# Patient Record
Sex: Female | Born: 1990 | Race: Black or African American | Hispanic: No | Marital: Single | Smoking: Former smoker
Health system: Southern US, Community
[De-identification: ages and names within clinical notes are randomized; demographics above are authoritative.]

## PROBLEM LIST (undated history)

## (undated) ENCOUNTER — Inpatient Hospital Stay (HOSPITAL_COMMUNITY): Payer: Self-pay

## (undated) DIAGNOSIS — R011 Cardiac murmur, unspecified: Secondary | ICD-10-CM

## (undated) DIAGNOSIS — D573 Sickle-cell trait: Secondary | ICD-10-CM

## (undated) DIAGNOSIS — M6740 Ganglion, unspecified site: Secondary | ICD-10-CM

## (undated) HISTORY — PX: HAND SURGERY: SHX662

## (undated) HISTORY — DX: Cardiac murmur, unspecified: R01.1

---
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
## 2012-03-25 ENCOUNTER — Encounter (HOSPITAL_COMMUNITY): Payer: Self-pay | Admitting: Emergency Medicine

## 2012-03-25 ENCOUNTER — Emergency Department (INDEPENDENT_AMBULATORY_CARE_PROVIDER_SITE_OTHER)
Admission: EM | Admit: 2012-03-25 | Discharge: 2012-03-25 | Disposition: A | Discharge status: Home / Self Care | Payer: Medicaid Other | Source: Home / Self Care | Attending: Emergency Medicine | Admitting: Emergency Medicine

## 2012-03-25 DIAGNOSIS — L03039 Cellulitis of unspecified toe: Secondary | ICD-10-CM

## 2012-03-25 DIAGNOSIS — L600 Ingrowing nail: Secondary | ICD-10-CM

## 2012-03-25 DIAGNOSIS — L03031 Cellulitis of right toe: Secondary | ICD-10-CM

## 2012-03-25 HISTORY — DX: Ganglion, unspecified site: M67.40

## 2012-03-25 MED ORDER — IBUPROFEN 600 MG PO TABS: 600.0000 mg | ORAL_TABLET | Freq: Four times a day (QID) | ORAL | Status: AC | PRN

## 2012-03-25 MED ORDER — CHLORHEXIDINE GLUCONATE 4 % EX LIQD: 60.0000 mL | Freq: Every day | CUTANEOUS | Status: AC | PRN

## 2012-03-25 MED ORDER — SULFAMETHOXAZOLE-TRIMETHOPRIM 800-160 MG PO TABS: 1.0000 | ORAL_TABLET | Freq: Two times a day (BID) | ORAL | Status: AC

## 2012-03-25 NOTE — ED Notes (Signed)
Patient received referral to foot center

## 2012-03-25 NOTE — Discharge Instructions (Signed)
Soak  Your foot in a solution of the chlorhexadine and warm water once a day. Thewn, ewdge some corton underneath your toenail. Then apply bacitracin or another anitbiotic ointment to the toe and wrap it in a clean bandage dressing. Wear cotton socks and wide shoes. Take the medication as written.Return if you get worse, have a  fever >100.4, or for any concerns.   Go to www.goodrx.com to look up your medications. This will give you a list of where you can find your prescriptions at the most affordable prices.

## 2012-03-25 NOTE — ED Notes (Signed)
Seen at [facility] in February for ingrown toenail.  Patient reports to will get a pocket of pus, then bleed.  Patient reports toe is improving.  Patient reports soaking in epsom salt water, but does not know how to take care of toe beyond soaking, how to dress

## 2012-03-26 NOTE — ED Provider Notes (Signed)
History     CSN: [id number]  Arrival date & time 03/25/12  1120   First MD Initiated Contact with Patient 03/25/12 1148      Chief Complaint  Patient presents with  . Toe Pain    (Consider location/radiation/quality/duration/timing/severity/associated sxs/prior treatment) HPI Comments: Patient status post ingrown toe nail removal 2 months ago here for persistent toe pain, swelling at the nail fold, purulent drainage. She's been soaking her foot in Epsom salts and water, but has not been doing any other local wound care. [facility]. No nausea, vomiting, fevers, erythema streaking up her foot, numbness. Patient is not diabetic.  Patient is a 21 y.o. female presenting with toe pain. The history is provided by the patient. No language interpreter was used.  Toe Pain This is a chronic problem. The current episode started more than 1 week ago. The problem occurs constantly. The problem has not changed since onset.The symptoms are aggravated by walking. The symptoms are relieved by nothing.    Past Medical History  Diagnosis Date  . Ganglion cyst     right hand    Past Surgical History  Procedure Date  . Hand surgery     History reviewed. No pertinent family history.  History  Substance Use Topics  . Smoking status: Never Smoker   . Smokeless tobacco: Not on file  . Alcohol Use: No    OB History    Grav Para Term Preterm Abortions TAB SAB Ect Mult Living                  Review of Systems  Constitutional: Negative for fever.  Gastrointestinal: Negative for nausea and vomiting.  Musculoskeletal: Negative for joint swelling.  Skin: Positive for color change and wound.    Allergies  Review of patient's allergies indicates no known allergies.  Home Medications   Current Outpatient Rx  Name Route Sig Dispense Refill  . CHLORHEXIDINE GLUCONATE 4 % EX LIQD Topical Apply 60 mLs (4 application total) topically daily as  needed. Use daily when bathing for 1-2 weeks 120 mL 0  . IBUPROFEN 600 MG PO TABS Oral Take 1 tablet (600 mg total) by mouth every 6 (six) hours as needed for pain. 30 tablet 0  . SULFAMETHOXAZOLE-TRIMETHOPRIM 800-160 MG PO TABS Oral Take 1 tablet by mouth 2 (two) times daily. 20 tablet 0    BP 121/73  Pulse 85  Temp(Src) 98.7 F (37.1 C) (Oral)  Resp 16  SpO2 100%  Physical Exam  Nursing note and vitals reviewed. Constitutional: She is oriented to person, place, and time. She appears well-developed and well-nourished. No distress.  HENT:  Head: Normocephalic and atraumatic.  Eyes: Conjunctivae and EOM are normal.  Neck: Normal range of motion.  Cardiovascular: Normal rate.   Pulmonary/Chest: Effort normal.  Abdominal: She exhibits no distension.  Musculoskeletal: Normal range of motion.       Feet:       Hypertrophied Tissue medial nail fold. Scant purulent expressible drainage. Nail appears to be growing back. Joint stable. No bony tenderness, signs of infection going of foot.  Neurological: She is alert and oriented to person, place, and time.  Skin: Skin is warm and dry.  Psychiatric: She has a normal mood and affect. Her behavior is normal. Judgment and thought content normal.    ED Course  Procedures (including critical care time)  Labs Reviewed - No data to display No results found.   1.  Cellulitis of toe of right foot   2. Ingrown right big toenail       MDM  [facility]. 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 Sending her home on Bactrim, will have her place cotton wedge underneath her toenail, keep her toe covered with bacitracin, and a clean dressing until she is able to be seen  by podiatrist  Luiz Blare, MD 03/26/12 2206

## 2013-01-28 ENCOUNTER — Encounter (HOSPITAL_COMMUNITY): Payer: Self-pay | Admitting: Emergency Medicine

## 2013-01-28 ENCOUNTER — Emergency Department (HOSPITAL_COMMUNITY)
Admission: EM | Admit: 2013-01-28 | Discharge: 2013-01-28 | Disposition: A | Discharge status: Home / Self Care | Payer: Medicaid Other | Attending: Emergency Medicine | Admitting: Emergency Medicine

## 2013-01-28 DIAGNOSIS — L03039 Cellulitis of unspecified toe: Secondary | ICD-10-CM | POA: Insufficient documentation

## 2013-01-28 DIAGNOSIS — L02619 Cutaneous abscess of unspecified foot: Secondary | ICD-10-CM | POA: Insufficient documentation

## 2013-01-28 DIAGNOSIS — L03031 Cellulitis of right toe: Secondary | ICD-10-CM

## 2013-01-28 DIAGNOSIS — Z8739 Personal history of other diseases of the musculoskeletal system and connective tissue: Secondary | ICD-10-CM | POA: Insufficient documentation

## 2013-01-28 MED ORDER — CHLORHEXIDINE GLUCONATE 4 % EX LIQD: 60.0000 mL | Freq: Every day | CUTANEOUS | Status: DC | PRN

## 2013-01-28 MED ORDER — IBUPROFEN 800 MG PO TABS: 800.0000 mg | ORAL_TABLET | Freq: Three times a day (TID) | ORAL | Status: DC

## 2013-01-28 MED ORDER — IBUPROFEN 400 MG PO TABS
800.0000 mg | ORAL_TABLET | Freq: Once | ORAL | Status: AC
  Administered 2013-01-28: 800 mg via ORAL
  Filled 2013-01-28: qty 2

## 2013-01-28 MED ORDER — SULFAMETHOXAZOLE-TRIMETHOPRIM 800-160 MG PO TABS: 1.0000 | ORAL_TABLET | Freq: Two times a day (BID) | ORAL | Status: AC

## 2013-01-28 NOTE — ED Notes (Signed)
C/o R great toe pain since having ingrown toenail removed 1 year ago.  Pain and swelling worse since yesterday.  Denies recent injury.

## 2013-01-31 NOTE — ED Provider Notes (Signed)
History     CSN: [id number]  Arrival date & time 01/28/13  1743   First MD Initiated Contact with Patient 01/28/13 1811      Chief Complaint  Patient presents with  . Toe Pain    (Consider location/radiation/quality/duration/timing/severity/associated sxs/prior treatment) HPI Comments: Patient is a 22 year old female who presents with a 1 year history of right great toe pain. Symptoms started gradually and progressively worsened since the onset. Patient reports the pain acutely worsened last night without known trigger. Patient did not try anything for pain. Palpation of toe makes the pain worse. Nothing makes the pain better. Patient reports associated redness and swelling. No fever.   Patient is a 22 y.o. female presenting with toe pain.  Toe Pain    Past Medical History  Diagnosis Date  . Ganglion cyst     right hand    Past Surgical History  Procedure Laterality Date  . Hand surgery      No family history on file.  History  Substance Use Topics  . Smoking status: Never Smoker   . Smokeless tobacco: Not on file  . Alcohol Use: No    OB History   Grav Para Term Preterm Abortions TAB SAB Ect Mult Living                  Review of Systems  Skin: Positive for color change and wound.  All other systems reviewed and are negative.    Allergies  Review of patient's allergies indicates no known allergies.  Home Medications   Current Outpatient Rx  Name  Route  Sig  Dispense  Refill  . ferrous fumarate (HEMOCYTE - 106 MG FE) 325 (106 FE) MG TABS   Oral   Take 1 tablet by mouth daily.         Marland Kitchen neomycin-bacitracin-polymyxin (NEOSPORIN) ointment   Topical   Apply 1 application topically every 12 (twelve) hours. apply to toe everyday per patient.         . chlorhexidine (HIBICLENS) 4 % external liquid   Topical   Apply 60 mLs (4 application total) topically daily as needed.   120 mL   0   . ibuprofen (ADVIL,MOTRIN) 800 MG tablet   Oral   Take 1  tablet (800 mg total) by mouth 3 (three) times daily.   21 tablet   0   . sulfamethoxazole-trimethoprim (BACTRIM DS,SEPTRA DS) 800-160 MG per tablet   Oral   Take 1 tablet by mouth 2 (two) times daily.   14 tablet   0     BP 133/75  Pulse 99  Temp(Src) 98.4 F (36.9 C) (Oral)  Resp 16  SpO2 99%  LMP 01/21/2013  Physical Exam  Nursing note and vitals reviewed. Constitutional: She is oriented to person, place, and time. She appears well-developed and well-nourished. No distress.  HENT:  Head: Normocephalic and atraumatic.  Eyes: Conjunctivae are normal.  Neck: Normal range of motion.  Cardiovascular: Normal rate and regular rhythm.  Exam reveals no gallop and no friction rub.   No murmur heard. Pulmonary/Chest: Effort normal and breath sounds normal. She has no wheezes. She has no rales. She exhibits no tenderness.  Abdominal: Soft. There is no tenderness.  Musculoskeletal: Normal range of motion.  Neurological: She is alert and oriented to person, place, and time. Coordination normal.  Speech is goal-oriented. Moves limbs without ataxia.   Skin: Skin is warm and dry.  Erythematous right great toe that is tender to  palpation.   Psychiatric: She has a normal mood and affect. Her behavior is normal.    ED Course  Procedures (including critical care time)  Labs Reviewed - No data to display No results found.   1. Cellulitis of great toe of right foot       MDM  Patient likely has cellulitis of right great toe from ingrown toenail. Patient will be treated with Bactrim, chlorhexidine, and ibuprofen. Patient will follow up with Podiatrist.         Emilia Beck, PA-C 01/31/13 1437

## 2013-01-31 NOTE — ED Provider Notes (Signed)
Medical screening examination/treatment/procedure(s) were performed by non-physician practitioner and as supervising physician I was immediately available for consultation/collaboration.   Gwyneth Sprout, MD 01/31/13 2101

## 2013-10-03 ENCOUNTER — Emergency Department (HOSPITAL_COMMUNITY): Payer: Medicaid Other

## 2013-10-03 ENCOUNTER — Encounter (HOSPITAL_COMMUNITY): Payer: Self-pay | Admitting: Emergency Medicine

## 2013-10-03 ENCOUNTER — Emergency Department (HOSPITAL_COMMUNITY)
Admission: EM | Admit: 2013-10-03 | Discharge: 2013-10-03 | Disposition: A | Discharge status: Home / Self Care | Payer: Medicaid Other | Attending: Emergency Medicine | Admitting: Emergency Medicine

## 2013-10-03 DIAGNOSIS — O368990 Maternal care for other specified fetal problems, unspecified trimester, not applicable or unspecified: Secondary | ICD-10-CM | POA: Insufficient documentation

## 2013-10-03 DIAGNOSIS — O209 Hemorrhage in early pregnancy, unspecified: Secondary | ICD-10-CM

## 2013-10-03 DIAGNOSIS — O418X10 Other specified disorders of amniotic fluid and membranes, first trimester, not applicable or unspecified: Secondary | ICD-10-CM

## 2013-10-03 DIAGNOSIS — O4690 Antepartum hemorrhage, unspecified, unspecified trimester: Secondary | ICD-10-CM | POA: Insufficient documentation

## 2013-10-03 DIAGNOSIS — N898 Other specified noninflammatory disorders of vagina: Secondary | ICD-10-CM | POA: Insufficient documentation

## 2013-10-03 DIAGNOSIS — O9989 Other specified diseases and conditions complicating pregnancy, childbirth and the puerperium: Secondary | ICD-10-CM | POA: Insufficient documentation

## 2013-10-03 DIAGNOSIS — Z8739 Personal history of other diseases of the musculoskeletal system and connective tissue: Secondary | ICD-10-CM | POA: Insufficient documentation

## 2013-10-03 DIAGNOSIS — R110 Nausea: Secondary | ICD-10-CM | POA: Insufficient documentation

## 2013-10-03 LAB — CBC
HCT: 27.3 % — ABNORMAL LOW (ref 36.0–46.0)
Hemoglobin: 9.4 g/dL — ABNORMAL LOW (ref 12.0–15.0)
MCH: 23.4 pg — ABNORMAL LOW (ref 26.0–34.0)
MCV: 67.9 fL — ABNORMAL LOW (ref 78.0–100.0)
RBC: 4.02 MIL/uL (ref 3.87–5.11)

## 2013-10-03 LAB — URINALYSIS, ROUTINE W REFLEX MICROSCOPIC
Bilirubin Urine: NEGATIVE
Glucose, UA: NEGATIVE mg/dL
Specific Gravity, Urine: 1.012 (ref 1.005–1.030)

## 2013-10-03 LAB — OB RESULTS CONSOLE GC/CHLAMYDIA
Chlamydia: NEGATIVE
GC PROBE AMP, GENITAL: NEGATIVE

## 2013-10-03 LAB — WET PREP, GENITAL
Trich, Wet Prep: NONE SEEN
Yeast Wet Prep HPF POC: NONE SEEN

## 2013-10-03 LAB — TYPE AND SCREEN
ABO/RH(D): O NEG
Antibody Screen: NEGATIVE

## 2013-10-03 LAB — URINE MICROSCOPIC-ADD ON

## 2013-10-03 LAB — POCT PREGNANCY, URINE: Preg Test, Ur: POSITIVE — AB

## 2013-10-03 MED ORDER — RHO D IMMUNE GLOBULIN 1500 UNIT/2ML IJ SOLN
300.0000 ug | Freq: Once | INTRAMUSCULAR | Status: AC
  Administered 2013-10-03: 300 ug via INTRAMUSCULAR

## 2013-10-03 NOTE — ED Provider Notes (Signed)
CSN: [id number]     Arrival date & time 10/03/13  0028 History   First MD Initiated Contact with Patient 10/03/13 0105     Chief Complaint  Patient presents with  . Vaginal Bleeding   (Consider location/radiation/quality/duration/timing/severity/associated sxs/prior Treatment) HPI Comments: Patient is a G3P0020 who is currently pregnant; LMP 07/20/2013. She presents today for vaginal bleeding x 1 hour. Patient states symptoms have been constant since onset without any modifying factors. She endorses associated nausea. Patient denies associated fever, CP, SOB, abdominal pain or cramping, diarrhea, melena, hematochezia, urinary symptoms, pelvic pain, and numbness/tingling. Patient endorses having RhoGAM in the past; likely Rh negative, but patient not sure.  Patient is a 22 y.o. female presenting with vaginal bleeding. The history is provided by the patient. No language interpreter was used.  Vaginal Bleeding Associated symptoms: nausea     Past Medical History  Diagnosis Date  . Ganglion cyst     right hand   Past Surgical History  Procedure Laterality Date  . Hand surgery     No family history on file. History  Substance Use Topics  . Smoking status: Never Smoker   . Smokeless tobacco: Not on file  . Alcohol Use: No   OB History   Grav Para Term Preterm Abortions TAB SAB Ect Mult Living   1              Review of Systems  Gastrointestinal: Positive for nausea.  Genitourinary: Positive for vaginal bleeding.  All other systems reviewed and are negative.    Allergies  Review of patient's allergies indicates no known allergies.  Home Medications   Current Outpatient Rx  Name  Route  Sig  Dispense  Refill  . Prenatal Vit-Fe Fumarate-FA (PRENATAL MULTIVITAMIN) TABS tablet   Oral   Take 1 tablet by mouth daily at 12 noon.          BP 104/50  Pulse 84  Temp(Src) 98.7 F (37.1 C) (Oral)  Resp 16  SpO2 100%  LMP 07/20/2013  Physical Exam  Nursing note and vitals  reviewed. Constitutional: She is oriented to person, place, and time. She appears well-developed and well-nourished. No distress.  HENT:  Head: Normocephalic and atraumatic.  Eyes: Conjunctivae and EOM are normal. No scleral icterus.  Neck: Normal range of motion.  Cardiovascular: Normal rate, regular rhythm, normal heart sounds and intact distal pulses.   Pulmonary/Chest: Effort normal. No respiratory distress.  Abdominal: Soft. She exhibits no distension. There is no tenderness. There is no rebound and no guarding.  No peritoneal signs or guarding.  Genitourinary: There is no rash, tenderness, lesion or injury on the right labia. There is no rash, tenderness, lesion or injury on the left labia. Uterus is not deviated and not tender. Cervix exhibits discharge (blood and clear mucous appreciated from cervical os; os closed.). Cervix exhibits no motion tenderness and no friability. Right adnexum displays no mass, no tenderness and no fullness. Left adnexum displays no mass, no tenderness and no fullness. There is bleeding around the vagina. No erythema or tenderness around the vagina. No foreign body around the vagina. No signs of injury around the vagina. Vaginal discharge (blood and clots in vaginal vault) found.  Musculoskeletal: Normal range of motion.  Neurological: She is alert and oriented to person, place, and time.  Skin: Skin is warm and dry. No rash noted. She is not diaphoretic. No erythema. No pallor.  Psychiatric: She has a normal mood and affect. Her behavior is normal.  ED Course  Procedures (including critical care time) Labs Review Labs Reviewed  WET PREP, GENITAL - Abnormal; Notable for the following:    WBC, Wet Prep HPF POC FEW (*)    All other components within normal limits  URINALYSIS, ROUTINE W REFLEX MICROSCOPIC - Abnormal; Notable for the following:    Hgb urine dipstick LARGE (*)    All other components within normal limits  HCG, QUANTITATIVE, PREGNANCY -  Abnormal; Notable for the following:    hCG, Beta Chain, Quant, S [address]109 (*)    All other components within normal limits  CBC - Abnormal; Notable for the following:    Hemoglobin 9.4 (*)    HCT 27.3 (*)    MCV 67.9 (*)    MCH 23.4 (*)    RDW 20.4 (*)    All other components within normal limits  POCT PREGNANCY, URINE - Abnormal; Notable for the following:    Preg Test, Ur POSITIVE (*)    All other components within normal limits  GC/CHLAMYDIA PROBE AMP  URINE MICROSCOPIC-ADD ON  TYPE AND SCREEN  ABO/RH  RH IG WORKUP (INCLUDES ABO/RH)   Imaging Review US Ob Comp Less 14 Wks  10/03/2013   CLINICAL DATA:  Vaginal bleeding  EXAM: OBSTETRIC <14 WK [place] AND TRANSVAGINAL OB US  TECHNIQUE: Both transabdominal and transvaginal ultrasound examinations were performed for complete evaluation of the gestation as well as the maternal uterus, adnexal regions, and pelvic cul-de-sac. Transvaginal technique was performed to assess early pregnancy.  COMPARISON:  None.  FINDINGS: Intrauterine gestational sac: Present. There is thin subchorionic hemorrhage present anteriorly and the inferiorly, with inferior component extending over the cervical os.  Yolk sac:  Normal in size.  Embryo:  Present.  Cardiac Activity: Present.  Heart Rate:  175 bpm  CRL:   3.6 cm 10 w 3 d                  [place] EDC: 04/28/2014  Maternal uterus/adnexae: Other than the gestation, the uterus is unremarkable. The right ovary measures 1 x 1.6 x 2.9 cm. Left ovary measures 3.6 x 2.3 x 4.2 cm. Size difference related to a approximately 2 cm dominant follicle.  IMPRESSION: 1. Living single intrauterine gestation, estimated age 47 weeks 3 days. 2. Thin subchorionic hemorrhage over the internal cervical os.   Electronically Signed   By: Tiburcio Pea M.D.   On: 10/03/2013 03:38   US Ob Transvaginal  10/03/2013   CLINICAL DATA:  Vaginal bleeding  EXAM: OBSTETRIC <14 WK [place] AND TRANSVAGINAL OB US  TECHNIQUE: Both transabdominal and transvaginal  ultrasound examinations were performed for complete evaluation of the gestation as well as the maternal uterus, adnexal regions, and pelvic cul-de-sac. Transvaginal technique was performed to assess early pregnancy.  COMPARISON:  None.  FINDINGS: Intrauterine gestational sac: Present. There is thin subchorionic hemorrhage present anteriorly and the inferiorly, with inferior component extending over the cervical os.  Yolk sac:  Normal in size.  Embryo:  Present.  Cardiac Activity: Present.  Heart Rate:  175 bpm  CRL:   3.6 cm 10 w 3 d                  [place] EDC: 04/28/2014  Maternal uterus/adnexae: Other than the gestation, the uterus is unremarkable. The right ovary measures 1 x 1.6 x 2.9 cm. Left ovary measures 3.6 x 2.3 x 4.2 cm. Size difference related to a approximately 2 cm dominant follicle.  IMPRESSION: 1. Living single intrauterine  gestation, estimated age 33 weeks 3 days. 2. Thin subchorionic hemorrhage over the internal cervical os.   Electronically Signed   By: Tiburcio Pea M.D.   On: 10/03/2013 03:38    EKG Interpretation   None       MDM   1. Vaginal bleeding in pregnancy, first trimester   2. Subchorionic hemorrhage in first trimester     Patient is a 22 year old female, G3P0020 who is currently pregnant, who presents for vaginal bleeding during pregnancy. Patient without abdominal pain or adnexal tenderness on exam. She is afebrile, hemodynamically stable, as well as well and nontoxic appearing. Patient is O NEG; tx in ED with RhoGAM today. OB U/S ordered for further evaluation of symptoms. Patient without any pain complaints.  OB ultrasound significant for a live intrauterine gestation of approximately 10 weeks 3 days. There is also a thin subchorionic hemorrhage over the internal cervical os. Have consulted with OB physician on call who recommends the patient refrain from sexual intercourse given subchorionic hemorrhage. Patient appropriate for d/c with referral to [facility] for follow up and prenatal care. Return precautions discussed and patient agreeable to plan with no unaddressed concerns.   Antony Madura, PA-C 10/03/13 [phone]

## 2013-10-03 NOTE — ED Notes (Signed)
Pt to US via stretcher

## 2013-10-03 NOTE — ED Notes (Signed)
Per lab: Type O neg. Antibody screen negative. "Pt did have to have Rhogam with last pregnancy".

## 2013-10-03 NOTE — ED Notes (Signed)
Pt st's she started having vag bleeding approx 40 mins ago.  St's she passed 2 small clots and now is spotting.  Pt st's she is 10 weeks preg.  Pt denies any abd. Pain or cramping.

## 2013-10-03 NOTE — ED Provider Notes (Signed)
Medical screening examination/treatment/procedure(s) were performed by non-physician practitioner and as supervising physician I was immediately available for consultation/collaboration.  Sunnie Nielsen, MD 10/03/13 [phone]

## 2013-10-03 NOTE — ED Notes (Signed)
Pt alert, NAD, calm, interactive, resps e/u, speaking in clear complete sentences, denies sx or complaints. (Denies: pain, sob, nv, dizziness or other sx).

## 2013-10-03 NOTE — ED Notes (Signed)
(  Denies: pain, sob, nausea or other sx), pt alert, NAD, calm, interactive, family at [place].

## 2013-10-03 NOTE — ED Notes (Signed)
No changes, updated about wait, calm, NAD. Denies new sx.

## 2013-10-03 NOTE — ED Notes (Signed)
Pt. reports vaginal spotting onset this evening , pt. stated she is [redacted] weeks pregnant ( G3P0 - 1 abortion / 1 miscarriage ) , denies abdominal cramping or pain .

## 2013-10-04 LAB — RH IG WORKUP (INCLUDES ABO/RH)
ABO/RH(D): O NEG
Antibody Screen: NEGATIVE
Gestational Age(Wks): 14

## 2013-10-15 ENCOUNTER — Other Ambulatory Visit (HOSPITAL_COMMUNITY): Payer: Self-pay | Admitting: Nurse Practitioner

## 2013-10-15 DIAGNOSIS — Z3689 Encounter for other specified antenatal screening: Secondary | ICD-10-CM

## 2013-10-15 LAB — OB RESULTS CONSOLE HIV ANTIBODY (ROUTINE TESTING): HIV: NONREACTIVE

## 2013-10-15 LAB — OB RESULTS CONSOLE RPR: RPR: NONREACTIVE

## 2013-10-15 LAB — OB RESULTS CONSOLE ABO/RH: RH Type: NEGATIVE

## 2013-10-15 LAB — OB RESULTS CONSOLE HEPATITIS B SURFACE ANTIGEN: Hepatitis B Surface Ag: NEGATIVE

## 2013-10-15 LAB — OB RESULTS CONSOLE ANTIBODY SCREEN: ANTIBODY SCREEN: POSITIVE

## 2013-10-15 LAB — OB RESULTS CONSOLE RUBELLA ANTIBODY, IGM: Rubella: IMMUNE

## 2013-10-24 ENCOUNTER — Ambulatory Visit (HOSPITAL_COMMUNITY): Payer: Medicaid Other | Attending: Cardiology

## 2013-10-24 ENCOUNTER — Encounter: Payer: Self-pay | Admitting: Cardiology

## 2013-10-24 ENCOUNTER — Ambulatory Visit (INDEPENDENT_AMBULATORY_CARE_PROVIDER_SITE_OTHER): Payer: Medicaid Other | Admitting: Cardiology

## 2013-10-24 VITALS — BP 120/58 | HR 92 | Ht 64.0 in | Wt 180.0 lb

## 2013-10-24 DIAGNOSIS — R079 Chest pain, unspecified: Secondary | ICD-10-CM

## 2013-10-24 NOTE — Patient Instructions (Signed)
**Note De-identified Rosselyn Martha Obfuscation** Your physician has requested that you have an echocardiogram. Echocardiography is a painless test that uses sound waves to create images of your heart. It provides your doctor with information about the size and shape of your heart and how well your heart's chambers and valves are working. This procedure takes approximately one hour. There are no restrictions for this procedure.  Your physician recommends that you schedule a follow-up appointment in: as needed  

## 2013-10-24 NOTE — Progress Notes (Signed)
Patient ID: Rachel Heath, female   DOB: [birth year], 22 y.o.   MRN: [id number]    Patient Name: Rachel Heath Date of Encounter: 10/24/2013  Primary Care Provider:  Default, Provider, MD Primary Cardiologist:  Tobias Alexander, H   Patient Profile  Heart murmur  Problem List   Past Medical History  Diagnosis Date  . Ganglion cyst     right hand  . Heart murmur     per parent   Past Surgical History  Procedure Laterality Date  . Hand surgery      Allergies  No Known Allergies  HPI  22 year old female, currently [redacted] weeks pregnant who is being referred to [place] by her OB/GYN for her evaluation of heart murmur. Patient has no prior medical history other than GERD. She states that she has had a heart murmur since she was born and was followed by her pediatrician's. She is not very physically active, but denies any significant symptoms of shortness of breath, dizziness or prior syncopal episode. She states that she has occasional chest pain but feels more like heartburn, they usually occur at rest, worsen when laying flat and after food intake. Sometimes she feels them on exertion. No PND or orthopnea. She denies palpitations.  Home Medications  Prior to Admission medications   Medication Sig Start Date End Date Taking? Authorizing Provider  Prenatal Vit-Fe Fumarate-FA (PRENATAL MULTIVITAMIN) TABS tablet Take 1 tablet by mouth daily at 12 noon.   Yes Historical Provider, MD    Family History  No family history on file.  Social History  History   Social History  . Marital Status: Single    Spouse Name: N/A    Number of Children: N/A  . Years of Education: N/A   Occupational History  . Not on file.   Social History Main Topics  . Smoking status: Never Smoker   . Smokeless tobacco: Not on file  . Alcohol Use: No  . Drug Use: No  . Sexual Activity: Yes    Birth Control/ Protection: Injection     Comment: last depo shot 11/2011   Other Topics Concern  . Not  on file   Social History Narrative  . No narrative on file     Review of Systems, as per HPI, otherwise negative General:  No chills, fever, night sweats or weight changes.  Cardiovascular:  No chest pain, dyspnea on exertion, edema, orthopnea, palpitations, paroxysmal nocturnal dyspnea. Dermatological: No rash, lesions/masses Respiratory: No cough, dyspnea Urologic: No hematuria, dysuria Abdominal:   No nausea, vomiting, diarrhea, bright red blood per rectum, melena, or hematemesis Neurologic:  No visual changes, wkns, changes in mental status. All other systems reviewed and are otherwise negative except as noted above.  Physical Exam  Blood pressure 120/58, pulse 92, height 5\' 4"  (1.626 m), weight 180 lb (81.647 kg), last menstrual period 07/20/2013, SpO2 98.00%.  General: Pleasant, NAD Psych: Normal affect. Neuro: Alert and oriented X 3. Moves all extremities spontaneously. HEENT: Normal  Neck: Supple without bruits or JVD. Lungs:  Resp regular and unlabored, CTA. Heart: RRR no s3, s4, mild systolic murmur heart at the left lower sternal border. Abdomen: Soft, non-tender, non-distended, BS + x 4.  Extremities: No clubbing, cyanosis or edema. DP/PT/Radials 2+ and equal bilaterally.  Accessory Clinical Findings  ECG - SR, 62 BPM, normal ECG    Assessment & Plan  The 22 year old female, currently [redacted] weeks pregnant, with a mild systolic murmur that most probably represents a physiologic murmur of  pregnancy. EKG is normal. Patient is asymptomatic other than normal symptoms of pregnancy. We'll order a transthoracic echocardiogram to be performed this afternoon to rule out any significant abnormality that might interfere with her pregnancy.  Followup as needed based on echo results.  Tobias Alexander, Rexene Edison, MD 10/24/2013, 12:33 PM

## 2013-10-31 ENCOUNTER — Ambulatory Visit (HOSPITAL_COMMUNITY): Payer: Medicaid Other | Attending: Cardiology | Admitting: Cardiology

## 2013-10-31 DIAGNOSIS — R011 Cardiac murmur, unspecified: Secondary | ICD-10-CM

## 2013-11-12 ENCOUNTER — Other Ambulatory Visit: Payer: Self-pay

## 2013-12-04 ENCOUNTER — Ambulatory Visit (HOSPITAL_COMMUNITY)
Admission: RE | Admit: 2013-12-04 | Discharge: 2013-12-04 | Disposition: A | Discharge status: Home / Self Care | Payer: Medicaid Other | Source: Ambulatory Visit | Attending: Nurse Practitioner | Admitting: Nurse Practitioner

## 2013-12-04 DIAGNOSIS — Z3689 Encounter for other specified antenatal screening: Secondary | ICD-10-CM | POA: Insufficient documentation

## 2013-12-20 NOTE — L&D Delivery Note (Signed)
Delivery Note At 7:17 PM a viable female was delivered via Vaginal, Spontaneous Delivery (Presentation: ; Occiput Anterior).  APGAR: 9, 9; weight TBD.   Placenta status: Intact, Spontaneous.  Cord: 3 vessels with the following complications: None.  Anesthesia: Epidural  Episiotomy: None Lacerations: None Suture Repair: none Est. Blood Loss (mL): 300cc  Mom to postpartum.  Baby to Couplet care / Skin to Skin.  Upon arrival patient was complete and pushing with good effort, progression to delivery without difficulty of healthy baby boy. After delivery baby with good tone and cry, placed on maternal abdomen for oral suction, drying and stim. Delayed cord clamping performed and cut by FOB. Placenta delivered with some torn membranes but overall intact with 3V cord. Vaginal canal and perineum was inspected and intact. Pitocin was started and uterus massaged until bleeding slowed. Counts of sharps, instruments, and lap pads were all correct.  Saralyn PilarAlexander Karamalegos, DO [facility], PGY-1 04/19/2014, 7:33 PM  I was present for and supervised the delivery of this newborn and agree with above documentation in the resident's note.   Rulon AbideKeli Kamyiah Colantonio, M.D. [facility] 04/19/2014 7:45 PM

## 2013-12-26 ENCOUNTER — Other Ambulatory Visit (HOSPITAL_COMMUNITY): Payer: Self-pay | Admitting: Nurse Practitioner

## 2013-12-26 DIAGNOSIS — IMO0002 Reserved for concepts with insufficient information to code with codable children: Secondary | ICD-10-CM

## 2014-01-08 ENCOUNTER — Telehealth: Payer: Self-pay | Admitting: Cardiology

## 2014-01-08 NOTE — Telephone Encounter (Signed)
That's exactly correct, she has no valvular abnormality and her murmur is just a murmur of pregancy. No antibiotics are necessary.  Could you please fax the form?  Thank you,  Tobias AlexanderKatarina Quinzell Malcomb

## 2014-01-08 NOTE — Telephone Encounter (Signed)
Echo done 10/31/2013--normal physiologic murmur during pregnancy. Based on these results pt does not require antibiotics to prevent bacterial endocarditis prior to dental appt. I will forward to Dr Delton SeeNelson for review.

## 2014-01-08 NOTE — Telephone Encounter (Signed)
New message     Patient has a murmur and need dental cleaning---she is a new pt at the dentist office.  They need to know if she will need prophylaxis treatment.  A form has been faxed.  Her appt is tomorrow.  pls fax form to [phone]

## 2014-01-08 NOTE — Telephone Encounter (Signed)
I have form and will fax--just wanted your OK.

## 2014-01-09 ENCOUNTER — Telehealth: Payer: Self-pay | Admitting: Cardiology

## 2014-01-09 NOTE — Telephone Encounter (Signed)
**Note De-Identified Gohan Collister Obfuscation** Clearance form re-faxed to Clydie BraunKaren @ [facility] @ [phone]. Clydie BraunKaren is aware.

## 2014-01-09 NOTE — Telephone Encounter (Signed)
New problem   Need to know status of medical clearance that was fax 01/08/14. They need to call pt by 9am today 01/09/14 in order for pt to be seen today in there office.

## 2014-01-10 ENCOUNTER — Ambulatory Visit (INDEPENDENT_AMBULATORY_CARE_PROVIDER_SITE_OTHER): Payer: Medicaid Other | Admitting: Podiatry

## 2014-01-10 ENCOUNTER — Encounter: Payer: Self-pay | Admitting: Podiatry

## 2014-01-10 VITALS — BP 103/64 | HR 94 | Resp 20 | Ht 64.0 in | Wt 182.0 lb

## 2014-01-10 DIAGNOSIS — L600 Ingrowing nail: Secondary | ICD-10-CM

## 2014-01-10 NOTE — Progress Notes (Signed)
   Subjective:    Patient ID: Rachel Heath, female    DOB: 05-12-1991, 23 y.o.   MRN: [id number]  HPI Comments: Pt states she was here last year and had a ingrown toenail on the right foot , and now the left great toenail is acting up , left great toenail both corners , pt is [redacted] weeks pregnant      Review of Systems  Genitourinary:       Pt is [redacted] weeks pregnant   All other systems reviewed and are negative.       Objective:   Physical Exam: I have reviewed her past medical history medications allergies surgeries and social history. Pulses are strongly palpable bilateral lower extremity. Neurologic sensorium is intact per Semmes-Weinstein monofilament. Deep tendon reflexes are intact bilateral. Muscle strength is 5 over 5 dorsiflexors plantar flexors inverters everters and all intrinsic musculature is intact. Orthopedic evaluation demonstrates mild pes planus bilateral. Otherwise all joints distal to the ankle have a full range of motion without crepitation. Cutaneous evaluation demonstrates supple well hydrated cutis mark erythema to the tibial border hallux left sharp incurvated nail margin. Currently there is no signs of infection only inflammation there is no purulence and no malodor and is minimally tender on palpation.        Assessment & Plan:  Assessment: Ingrown nail tibial border hallux left.  Plan: Due to her pregnancy I suggested that she soak in Epsom salts and water to alleviate her symptoms. Once she has delivered and has completed breast-feeding we will consider surgical intervention to the tibial border of the hallux left.

## 2014-01-15 ENCOUNTER — Ambulatory Visit (HOSPITAL_COMMUNITY)
Admission: RE | Admit: 2014-01-15 | Discharge: 2014-01-15 | Disposition: A | Discharge status: Home / Self Care | Payer: Medicaid Other | Source: Ambulatory Visit | Attending: Nurse Practitioner | Admitting: Nurse Practitioner

## 2014-01-15 DIAGNOSIS — Z3689 Encounter for other specified antenatal screening: Secondary | ICD-10-CM | POA: Insufficient documentation

## 2014-01-15 DIAGNOSIS — IMO0002 Reserved for concepts with insufficient information to code with codable children: Secondary | ICD-10-CM

## 2014-01-15 NOTE — Progress Notes (Signed)
[facility]  Indication: 23 yr old 183P0020 at [id number] for follow up ultrasound to complete fetal anatomic survey. Remote read.  Findings: 1. Single intrauterine pregnancy. 2. Estimated fetal weight is in the 45th%. 3. Posterior placenta without evidence of previa. 4. Normal amniotic fluid volume. 5. Normal transabdominal cervical length. 6. The views of the right ventricular outflow tract are limited. 7. The remainder of the limited anatomy survey is normal. Any anatomy not evaluated on today's exam was evaluated on the previous exam.  Recommendations: 1. Appropriate fetal growth. 2. Limited anatomy survey: - recommend follow up ultrasound in 2-4 weeks in the [facility] to complete anatomy 3. Per chart previous positive anti-D antibody: - was likely due to Rhogam administration - recommend repeating to ensure negative now - if negative would recommend repeat Rhogam at 28 weeks  Eulis FosterKristen Jeweline Reif, MD

## 2014-01-17 ENCOUNTER — Other Ambulatory Visit (HOSPITAL_COMMUNITY): Payer: Self-pay | Admitting: Obstetrics and Gynecology

## 2014-01-17 DIAGNOSIS — Z0489 Encounter for examination and observation for other specified reasons: Secondary | ICD-10-CM

## 2014-01-17 DIAGNOSIS — O360190 Maternal care for anti-D [Rh] antibodies, unspecified trimester, not applicable or unspecified: Secondary | ICD-10-CM

## 2014-01-17 DIAGNOSIS — IMO0002 Reserved for concepts with insufficient information to code with codable children: Secondary | ICD-10-CM

## 2014-02-07 ENCOUNTER — Encounter (HOSPITAL_COMMUNITY): Payer: Self-pay

## 2014-02-07 ENCOUNTER — Ambulatory Visit (HOSPITAL_COMMUNITY)
Admission: RE | Admit: 2014-02-07 | Discharge: 2014-02-07 | Disposition: A | Discharge status: Home / Self Care | Payer: Medicaid Other | Source: Ambulatory Visit | Attending: Nurse Practitioner | Admitting: Nurse Practitioner

## 2014-02-07 DIAGNOSIS — O360190 Maternal care for anti-D [Rh] antibodies, unspecified trimester, not applicable or unspecified: Secondary | ICD-10-CM

## 2014-02-07 DIAGNOSIS — Z0489 Encounter for examination and observation for other specified reasons: Secondary | ICD-10-CM

## 2014-02-07 DIAGNOSIS — Z3689 Encounter for other specified antenatal screening: Secondary | ICD-10-CM | POA: Insufficient documentation

## 2014-02-07 DIAGNOSIS — IMO0002 Reserved for concepts with insufficient information to code with codable children: Secondary | ICD-10-CM

## 2014-03-15 ENCOUNTER — Encounter (HOSPITAL_COMMUNITY): Payer: Self-pay | Admitting: *Deleted

## 2014-03-15 ENCOUNTER — Inpatient Hospital Stay (HOSPITAL_COMMUNITY)
Admission: AD | Admit: 2014-03-15 | Discharge: 2014-03-15 | Disposition: A | Discharge status: Home / Self Care | Payer: Medicaid Other | Source: Ambulatory Visit | Attending: Obstetrics & Gynecology | Admitting: Obstetrics & Gynecology

## 2014-03-15 DIAGNOSIS — O99891 Other specified diseases and conditions complicating pregnancy: Secondary | ICD-10-CM | POA: Insufficient documentation

## 2014-03-15 DIAGNOSIS — O26899 Other specified pregnancy related conditions, unspecified trimester: Secondary | ICD-10-CM

## 2014-03-15 DIAGNOSIS — O219 Vomiting of pregnancy, unspecified: Secondary | ICD-10-CM

## 2014-03-15 DIAGNOSIS — O9989 Other specified diseases and conditions complicating pregnancy, childbirth and the puerperium: Secondary | ICD-10-CM

## 2014-03-15 DIAGNOSIS — R51 Headache: Secondary | ICD-10-CM | POA: Insufficient documentation

## 2014-03-15 DIAGNOSIS — R519 Headache, unspecified: Secondary | ICD-10-CM

## 2014-03-15 DIAGNOSIS — O212 Late vomiting of pregnancy: Secondary | ICD-10-CM | POA: Insufficient documentation

## 2014-03-15 HISTORY — DX: Sickle-cell trait: D57.3

## 2014-03-15 LAB — URINALYSIS, ROUTINE W REFLEX MICROSCOPIC
Bilirubin Urine: NEGATIVE
Glucose, UA: NEGATIVE mg/dL
Hgb urine dipstick: NEGATIVE
KETONES UR: NEGATIVE mg/dL
LEUKOCYTES UA: NEGATIVE
Nitrite: NEGATIVE
PROTEIN: NEGATIVE mg/dL
Specific Gravity, Urine: 1.015 (ref 1.005–1.030)
Urobilinogen, UA: 0.2 mg/dL (ref 0.0–1.0)
pH: 8 (ref 5.0–8.0)

## 2014-03-15 LAB — GLUCOSE, CAPILLARY: Glucose-Capillary: 89 mg/dL (ref 70–99)

## 2014-03-15 NOTE — Discharge Instructions (Signed)
Tension Headache A tension headache is a feeling of pain, pressure, or aching often felt over the front and sides of the head. The pain can be dull or can feel tight (constricting). It is the most common type of headache. Tension headaches are not normally associated with nausea or vomiting and do not get worse with physical activity. Tension headaches can last 30 minutes to several days.  CAUSES  The exact cause is not known, but it may be caused by chemicals and hormones in the brain that lead to pain. Tension headaches often begin after stress, anxiety, or depression. Other triggers may include:  Alcohol.  Caffeine (too much or withdrawal).  Respiratory infections (colds, flu, sinus infections).  Dental problems or teeth clenching.  Fatigue.  Holding your head and neck in one position too long while using a computer. SYMPTOMS   Pressure around the head.   Dull, aching head pain.   Pain felt over the front and sides of the head.   Tenderness in the muscles of the head, neck, and shoulders. DIAGNOSIS  A tension headache is often diagnosed based on:   Symptoms.   Physical examination.   A CT scan or MRI of your head. These tests may be ordered if symptoms are severe or unusual. TREATMENT  Medicines may be given to help relieve symptoms.  HOME CARE INSTRUCTIONS   Only take over-the-counter or prescription medicines for pain or discomfort as directed by your caregiver.   Lie down in a dark, quiet room when you have a headache.   Keep a journal to find out what may be triggering your headaches. For example, write down:  What you eat and drink.  How much sleep you get.  Any change to your diet or medicines.  Try massage or other relaxation techniques.   Ice packs or heat applied to the head and neck can be used. Use these 3 to 4 times per day for 15 to 20 minutes each time, or as needed.   Limit stress.   Sit up straight, and do not tense your muscles.    Quit smoking if you smoke.  Limit alcohol use.  Decrease the amount of caffeine you drink, or stop drinking caffeine.  Eat and exercise regularly.  Get 7 to 9 hours of sleep, or as recommended by your caregiver.  Avoid excessive use of pain medicine as recurrent headaches can occur.  SEEK MEDICAL CARE IF:   You have problems with the medicines you were prescribed.  Your medicines do not work.  You have a change from the usual headache.  You have nausea or vomiting. SEEK IMMEDIATE MEDICAL CARE IF:   Your headache becomes severe.  You have a fever.  You have a stiff neck.  You have loss of vision.  You have muscular weakness or loss of muscle control.  You lose your balance or have trouble walking.  You feel faint or pass out.  You have severe symptoms that are different from your first symptoms. MAKE SURE YOU:   Understand these instructions.  Will watch your condition.  Will get help right away if you are not doing well or get worse. Document Released: 12/06/2005 Document Revised: 02/28/2012 Document Reviewed: 11/26/2011 [facility] Patient Information 2014 [place], [place]. Third Trimester of Pregnancy The third trimester is from week 29 through week 42, months 7 through 9. The third trimester is a time when the fetus is growing rapidly. At the end of the ninth month, the fetus is about 38  inches in length and weighs 6 10 pounds.  BODY CHANGES Your body goes through many changes during pregnancy. The changes vary from woman to woman.   Your weight will continue to increase. You can expect to gain 25 35 pounds (11 16 kg) by the end of the pregnancy.  You may begin to get stretch marks on your hips, abdomen, and breasts.  You may urinate more often because the fetus is moving lower into your pelvis and pressing on your bladder.  You may develop or continue to have heartburn as a result of your pregnancy.  You may develop constipation because certain  hormones are causing the muscles that push waste through your intestines to slow down.  You may develop hemorrhoids or swollen, bulging veins (varicose veins).  You may have pelvic pain because of the weight gain and pregnancy hormones relaxing your joints between the bones in your pelvis. Back aches may result from over exertion of the muscles supporting your posture.  Your breasts will continue to grow and be tender. A yellow discharge may leak from your breasts called colostrum.  Your belly button may stick out.  You may feel short of breath because of your expanding uterus.  You may notice the fetus "dropping," or moving lower in your abdomen.  You may have a bloody mucus discharge. This usually occurs a few days to a week before labor begins.  Your cervix becomes thin and soft (effaced) near your due date. WHAT TO EXPECT AT YOUR PRENATAL EXAMS  You will have prenatal exams every 2 weeks until week 36. Then, you will have weekly prenatal exams. During a routine prenatal visit:  You will be weighed to make sure you and the fetus are growing normally.  Your blood pressure is taken.  Your abdomen will be measured to track your baby's growth.  The fetal heartbeat will be listened to.  Any test results from the previous visit will be discussed.  You may have a cervical check near your due date to see if you have effaced. At around 36 weeks, your caregiver will check your cervix. At the same time, your caregiver will also perform a test on the secretions of the vaginal tissue. This test is to determine if a type of bacteria, Group B streptococcus, is present. Your caregiver will explain this further. Your caregiver may ask you:  What your birth plan is.  How you are feeling.  If you are feeling the baby move.  If you have had any abnormal symptoms, such as leaking fluid, bleeding, severe headaches, or abdominal cramping.  If you have any questions. Other tests or screenings  that may be performed during your third trimester include:  Blood tests that check for low iron levels (anemia).  Fetal testing to check the health, activity level, and growth of the fetus. Testing is done if you have certain medical conditions or if there are problems during the pregnancy. FALSE LABOR You may feel small, irregular contractions that eventually go away. These are called Braxton Hicks contractions, or false labor. Contractions may last for hours, days, or even weeks before true labor sets in. If contractions come at regular intervals, intensify, or become painful, it is best to be seen by your caregiver.  SIGNS OF LABOR   Menstrual-like cramps.  Contractions that are 5 minutes apart or less.  Contractions that start on the top of the uterus and spread down to the lower abdomen and back.  A sense of increased pelvic  pressure or back pain.  A watery or bloody mucus discharge that comes from the vagina. If you have any of these signs before the 37th week of pregnancy, call your caregiver right away. You need to go to the hospital to get checked immediately. HOME CARE INSTRUCTIONS   Avoid all smoking, herbs, alcohol, and unprescribed drugs. These chemicals affect the formation and growth of the baby.  Follow your caregiver's instructions regarding medicine use. There are medicines that are either safe or unsafe to take during pregnancy.  Exercise only as directed by your caregiver. Experiencing uterine cramps is a good sign to stop exercising.  Continue to eat regular, healthy meals.  Wear a good support bra for breast tenderness.  Do not use hot tubs, steam rooms, or saunas.  Wear your seat belt at all times when driving.  Avoid raw meat, uncooked cheese, cat litter boxes, and soil used by cats. These carry germs that can cause birth defects in the baby.  Take your prenatal vitamins.  Try taking a stool softener (if your caregiver approves) if you develop  constipation. Eat more high-fiber foods, such as fresh vegetables or fruit and whole grains. Drink plenty of fluids to keep your urine clear or pale yellow.  Take warm sitz baths to soothe any pain or discomfort caused by hemorrhoids. Use hemorrhoid cream if your caregiver approves.  If you develop varicose veins, wear support hose. Elevate your feet for 15 minutes, 3 4 times a day. Limit salt in your diet.  Avoid heavy lifting, wear low heal shoes, and practice good posture.  Rest a lot with your legs elevated if you have leg cramps or low back pain.  Visit your dentist if you have not gone during your pregnancy. Use a soft toothbrush to brush your teeth and be gentle when you floss.  A sexual relationship may be continued unless your caregiver directs you otherwise.  Do not travel far distances unless it is absolutely necessary and only with the approval of your caregiver.  Take prenatal classes to understand, practice, and ask questions about the labor and delivery.  Make a trial run to the hospital.  Pack your hospital bag.  Prepare the baby's nursery.  Continue to go to all your prenatal visits as directed by your caregiver. SEEK MEDICAL CARE IF:  You are unsure if you are in labor or if your water has broken.  You have dizziness.  You have mild pelvic cramps, pelvic pressure, or nagging pain in your abdominal area.  You have persistent nausea, vomiting, or diarrhea.  You have a bad smelling vaginal discharge.  You have pain with urination. SEEK IMMEDIATE MEDICAL CARE IF:   You have a fever.  You are leaking fluid from your vagina.  You have spotting or bleeding from your vagina.  You have severe abdominal cramping or pain.  You have rapid weight loss or gain.  You have shortness of breath with chest pain.  You notice sudden or extreme swelling of your face, hands, ankles, feet, or legs.  You have not felt your baby move in over an hour.  You have severe  headaches that do not go away with medicine.  You have vision changes. Document Released: 11/30/2001 Document Revised: 08/08/2013 Document Reviewed: 02/06/2013 [facility] Patient Information 2014 [place], [place].

## 2014-03-15 NOTE — MAU Note (Signed)
Pt states she woke up this afternoon to go to work & had a HA.  She had a BM & then felt very nauseated & shaky.  Denies any bleeding or LOF.

## 2014-03-15 NOTE — MAU Provider Note (Signed)
History     CSN: [id number]  Arrival date and time: 03/15/14 1633   First Provider Initiated Contact with Patient 03/15/14 1800      Chief Complaint  Patient presents with  . Nausea  . Headache   Headache    HA x 4 d, dull achy, frontal HA. This morning associated with Nausea. Not sleeping well. Denies CP, ShoB, LOC, falls, weakness or changes in sensorium. No speech or hearing changes. No change in appetite other than today. While waiting for provider in MAU nausea resolved.  No VB, LOF, Ctx. +FM.   OB History   Grav Para Term Preterm Abortions TAB SAB Ect Mult Living   1                Past Medical History  Diagnosis Date  . Ganglion cyst     right hand  . Heart murmur     per parent  . Sickle cell trait     Past Surgical History  Procedure Laterality Date  . Hand surgery      History reviewed. No pertinent family history.  History  Substance Use Topics  . Smoking status: Never Smoker   . Smokeless tobacco: Not on file  . Alcohol Use: No    Allergies: No Known Allergies  Prescriptions prior to admission  Medication Sig Dispense Refill  . calcium carbonate (TUMS - DOSED IN MG ELEMENTAL CALCIUM) 500 MG chewable tablet Chew 2 tablets by mouth daily as needed for indigestion or heartburn.      . Prenatal Vit-Fe Fumarate-FA (PRENATAL MULTIVITAMIN) TABS tablet Take 1 tablet by mouth daily at 12 noon.        ROS See HPI Physical Exam   Blood pressure 113/64, pulse 95, temperature 98.2 F (36.8 C), temperature source Oral, resp. rate 18, last menstrual period 07/20/2013, SpO2 100.00%.  Physical Exam  Constitutional: She is oriented to person, place, and time. She appears well-developed and well-nourished. No distress.  HENT:  Head: Normocephalic and atraumatic.  Cardiovascular: Normal rate, regular rhythm and normal heart sounds.   No murmur heard. Respiratory: Effort normal and breath sounds normal. No respiratory distress.  GI: Bowel sounds are normal.  She exhibits no distension. There is no tenderness. There is no rebound and no guarding.  Neurological: She is alert and oriented to person, place, and time.  Negative clonus  Skin: Skin is warm and dry. No rash noted. No erythema.  Psychiatric: She has a normal mood and affect. Her behavior is normal. Judgment and thought content normal.    MAU Course  Procedures  Results for orders placed during the hospital encounter of 03/15/14 (from the past 24 hour(s))  URINALYSIS, ROUTINE W REFLEX MICROSCOPIC     Status: None   Collection Time    03/15/14  4:40 PM      Result Value Ref Range   Color, Urine YELLOW  YELLOW   APPearance CLEAR  CLEAR   Specific Gravity, Urine 1.015  1.005 - 1.030   pH 8.0  5.0 - 8.0   Glucose, UA NEGATIVE  NEGATIVE mg/dL   Hgb urine dipstick NEGATIVE  NEGATIVE   Bilirubin Urine NEGATIVE  NEGATIVE   Ketones, ur NEGATIVE  NEGATIVE mg/dL   Protein, ur NEGATIVE  NEGATIVE mg/dL   Urobilinogen, UA 0.2  0.0 - 1.0 mg/dL   Nitrite NEGATIVE  NEGATIVE   Leukocytes, UA NEGATIVE  NEGATIVE  GLUCOSE, CAPILLARY     Status: None   Collection Time  03/15/14  5:05 PM      Result Value Ref Range   Glucose-Capillary 89  70 - 99 mg/dL  FHT: Category I Baseline 150 moderate variability; accels present, decels absent Toco: likely uterine irritability; no definite ctx    Assessment and Plan  Rachel Heath is a 23 y.o. G1P0 @ 3023w0d presents for HA and Nausea. Sickle cell trait, Rh -.   # Nausea; notes she is a finicky eater,  - resolved - encouraged eating smaller portions more frequently as part of a well-balanced diet  # HA; most likely tension, chronic issue, present for 4 days. Frequent HA during pregnancy. Thinks it's likely due to lack of sleep.  - offered tylenol, but not interested in taking - encouraged hydration and good PO intake of H2O - encouraged good sleep hygiene  # Gestation;  - continue routine care with [facility].    Michaelene SongHall, Jonathan C 03/15/2014, 6:22 PM    I have seen and examined this patient and agree with above documentation in the resident's note. FWB cat I tracing. Occasional irregular contractions not felt by pt on tOCO so SVE done and 0/thick/high.  HA likely tension. Discussed tylenol and unisom if truly due to lack of sleep. Nausea spontaneously resolved.  [facility]   Rulon AbideKeli Mike Hamre, M.D. [facility] 03/15/2014 6:45 PM

## 2014-03-15 NOTE — MAU Note (Signed)
Pt states she had not eaten anything today when she became nauseated & shaky, had drank some apple juice.

## 2014-03-18 NOTE — MAU Provider Note (Signed)
Attestation of Attending Supervision of Advanced Practitioner (CNM/NP): Evaluation and management procedures were performed by the Advanced Practitioner under my supervision and collaboration.  I have reviewed the Advanced Practitioner's note and chart, and I agree with the management and plan.  HARRAWAY-SMITH, Yanis Larin 7:29 AM     

## 2014-04-01 ENCOUNTER — Encounter (HOSPITAL_COMMUNITY): Payer: Self-pay

## 2014-04-01 ENCOUNTER — Inpatient Hospital Stay (HOSPITAL_COMMUNITY)
Admission: AD | Admit: 2014-04-01 | Discharge: 2014-04-01 | Disposition: A | Discharge status: Home / Self Care | Payer: Medicaid Other | Source: Ambulatory Visit | Attending: Obstetrics & Gynecology | Admitting: Obstetrics & Gynecology

## 2014-04-01 DIAGNOSIS — O212 Late vomiting of pregnancy: Secondary | ICD-10-CM | POA: Insufficient documentation

## 2014-04-01 DIAGNOSIS — O99891 Other specified diseases and conditions complicating pregnancy: Secondary | ICD-10-CM | POA: Insufficient documentation

## 2014-04-01 DIAGNOSIS — R51 Headache: Secondary | ICD-10-CM | POA: Insufficient documentation

## 2014-04-01 DIAGNOSIS — G44209 Tension-type headache, unspecified, not intractable: Secondary | ICD-10-CM | POA: Diagnosis not present

## 2014-04-01 DIAGNOSIS — O99019 Anemia complicating pregnancy, unspecified trimester: Secondary | ICD-10-CM | POA: Insufficient documentation

## 2014-04-01 DIAGNOSIS — D573 Sickle-cell trait: Secondary | ICD-10-CM | POA: Insufficient documentation

## 2014-04-01 DIAGNOSIS — O9989 Other specified diseases and conditions complicating pregnancy, childbirth and the puerperium: Principal | ICD-10-CM

## 2014-04-01 LAB — URINALYSIS, ROUTINE W REFLEX MICROSCOPIC
Bilirubin Urine: NEGATIVE
Glucose, UA: NEGATIVE mg/dL
Hgb urine dipstick: NEGATIVE
Ketones, ur: 15 mg/dL — AB
LEUKOCYTES UA: NEGATIVE
NITRITE: NEGATIVE
Protein, ur: NEGATIVE mg/dL
Specific Gravity, Urine: 1.02 (ref 1.005–1.030)
Urobilinogen, UA: 0.2 mg/dL (ref 0.0–1.0)
pH: 6 (ref 5.0–8.0)

## 2014-04-01 MED ORDER — BUTALBITAL-APAP-CAFFEINE 50-325-40 MG PO TABS: 2.0000 | ORAL_TABLET | Freq: Once | ORAL | Status: DC

## 2014-04-01 MED ORDER — BUTALBITAL-APAP-CAFFEINE 50-325-40 MG PO TABS
2.0000 | ORAL_TABLET | Freq: Once | ORAL | Status: AC
  Administered 2014-04-01: 2 via ORAL
  Filled 2014-04-01: qty 2

## 2014-04-01 NOTE — MAU Note (Signed)
Patient states she started having a headache while at work today, states she started to have blurred vision with seeing spots. Had nausea, no vomiting. Denies contractions or bleeding, but has a little vaginal discharge/leaking. Has irregular cramping that causes legs to hurt. Has back pain. Reports good fetal movement.

## 2014-04-01 NOTE — MAU Provider Note (Signed)
None     Chief Complaint:  Headache and Nausea   Rachel Heath is  23 y.o. G1P0 at 224w3d presents complaining of Headache and Nausea Pt reports that she had a severe headache and saw spots this afternoon at work. Pt states it is bilateral temporal and worse with light. Not affected by sound. Pt states that she has not had similar episodes in the past. Pt reports she does have spots when standing from sitting or using the restroom that lasts for several seconds before resolution. No issues at this time.   No current vision changes, no RUQ pain. Normal Fetal movement, no LOF, no vb, no ctx  No CP, sob, occassional nausea, no vomiting, no d/c, urinary symptoms or other complaints.   Obstetrical/Gynecological History: OB History   Grav Para Term Preterm Abortions TAB SAB Ect Mult Living   1              Past Medical History: Past Medical History  Diagnosis Date  . Ganglion cyst     right hand  . Heart murmur     per parent  . Sickle cell trait     Past Surgical History: Past Surgical History  Procedure Laterality Date  . Hand surgery      Family History: History reviewed. No pertinent family history.  Social History: History  Substance Use Topics  . Smoking status: Never Smoker   . Smokeless tobacco: Not on file  . Alcohol Use: No    Allergies: No Known Allergies  Meds:  Prescriptions prior to admission  Medication Sig Dispense Refill  . calcium carbonate (TUMS - DOSED IN MG ELEMENTAL CALCIUM) 500 MG chewable tablet Chew 2 tablets by mouth daily as needed for indigestion or heartburn.      . Prenatal Vit-Fe Fumarate-FA (PRENATAL MULTIVITAMIN) TABS tablet Take 1 tablet by mouth daily at 12 noon.        Review of Systems -   see above    Physical Exam  Blood pressure 124/68, pulse 93, temperature 98.2 F (36.8 C), temperature source Oral, resp. rate 16, height 5\' 5"  (1.651 m), weight 89.086 kg (196 lb 6.4 oz), last menstrual period 07/20/2013, SpO2  99.00%. GENERAL: Well-developed, well-nourished female in no acute distress.  LUNGS: Clear to auscultation bilaterally.  HEART: Regular rate and rhythm. ABDOMEN: Soft, nontender, nondistended, gravid.  EXTREMITIES: Nontender, no edema, 2+ distal pulses. DTR's 2+  FHT:  Baseline rate 140s bpm   Variability moderate  Accelerations present   Decelerations none Contractions: None   Labs: Results for orders placed during the hospital encounter of 04/01/14 (from the past 24 hour(s))  URINALYSIS, ROUTINE W REFLEX MICROSCOPIC   Collection Time    04/01/14  7:05 PM      Result Value Ref Range   Color, Urine YELLOW  YELLOW   APPearance CLEAR  CLEAR   Specific Gravity, Urine 1.020  1.005 - 1.030   pH 6.0  5.0 - 8.0   Glucose, UA NEGATIVE  NEGATIVE mg/dL   Hgb urine dipstick NEGATIVE  NEGATIVE   Bilirubin Urine NEGATIVE  NEGATIVE   Ketones, ur 15 (*) NEGATIVE mg/dL   Protein, ur NEGATIVE  NEGATIVE mg/dL   Urobilinogen, UA 0.2  0.0 - 1.0 mg/dL   Nitrite NEGATIVE  NEGATIVE   Leukocytes, UA NEGATIVE  NEGATIVE   Imaging Studies:  No results found.  Assessment: Rachel Pesayeasha M Arnott is  23 y.o. G1P0 at 224w3d presents with headache. Treated with fioricet with resolution of  symptoms. Will give Rx for fioricet and will follow up as previously scheduled with HD. Fetus Reassuring and reactive  Minta BalsamMichael R Josalin Carneiro 4/13/20157:36 PM

## 2014-04-01 NOTE — Discharge Instructions (Signed)

## 2014-04-03 LAB — OB RESULTS CONSOLE GBS: STREP GROUP B AG: NEGATIVE

## 2014-04-04 ENCOUNTER — Inpatient Hospital Stay (HOSPITAL_COMMUNITY)
Admission: AD | Admit: 2014-04-04 | Discharge: 2014-04-04 | Disposition: A | Discharge status: Home / Self Care | Payer: Medicaid Other | Source: Ambulatory Visit | Attending: Obstetrics & Gynecology | Admitting: Obstetrics & Gynecology

## 2014-04-04 ENCOUNTER — Encounter (HOSPITAL_COMMUNITY): Payer: Self-pay | Admitting: *Deleted

## 2014-04-04 DIAGNOSIS — M6740 Ganglion, unspecified site: Secondary | ICD-10-CM | POA: Insufficient documentation

## 2014-04-04 DIAGNOSIS — O4703 False labor before 37 completed weeks of gestation, third trimester: Secondary | ICD-10-CM

## 2014-04-04 DIAGNOSIS — O4700 False labor before 37 completed weeks of gestation, unspecified trimester: Secondary | ICD-10-CM | POA: Insufficient documentation

## 2014-04-04 DIAGNOSIS — R011 Cardiac murmur, unspecified: Secondary | ICD-10-CM | POA: Insufficient documentation

## 2014-04-04 DIAGNOSIS — O9989 Other specified diseases and conditions complicating pregnancy, childbirth and the puerperium: Principal | ICD-10-CM

## 2014-04-04 DIAGNOSIS — O99019 Anemia complicating pregnancy, unspecified trimester: Secondary | ICD-10-CM

## 2014-04-04 DIAGNOSIS — D573 Sickle-cell trait: Secondary | ICD-10-CM | POA: Insufficient documentation

## 2014-04-04 DIAGNOSIS — O99891 Other specified diseases and conditions complicating pregnancy: Secondary | ICD-10-CM

## 2014-04-04 NOTE — Discharge Instructions (Signed)
Third Trimester of Pregnancy  The third trimester is from week 29 through week 42, months 7 through 9. The third trimester is a time when the fetus is growing rapidly. At the end of the ninth month, the fetus is about 20 inches in length and weighs 6 10 pounds.   BODY CHANGES  Your body goes through many changes during pregnancy. The changes vary from woman to woman.    Your weight will continue to increase. You can expect to gain 25 35 pounds (11 16 kg) by the end of the pregnancy.   You may begin to get stretch marks on your hips, abdomen, and breasts.   You may urinate more often because the fetus is moving lower into your pelvis and pressing on your bladder.   You may develop or continue to have heartburn as a result of your pregnancy.   You may develop constipation because certain hormones are causing the muscles that push waste through your intestines to slow down.   You may develop hemorrhoids or swollen, bulging veins (varicose veins).   You may have pelvic pain because of the weight gain and pregnancy hormones relaxing your joints between the bones in your pelvis. Back aches may result from over exertion of the muscles supporting your posture.   Your breasts will continue to grow and be tender. A yellow discharge may leak from your breasts called colostrum.   Your belly button may stick out.   You may feel short of breath because of your expanding uterus.   You may notice the fetus "dropping," or moving lower in your abdomen.   You may have a bloody mucus discharge. This usually occurs a few days to a week before labor begins.   Your cervix becomes thin and soft (effaced) near your due date.  WHAT TO EXPECT AT YOUR PRENATAL EXAMS   You will have prenatal exams every 2 weeks until week 36. Then, you will have weekly prenatal exams. During a routine prenatal visit:   You will be weighed to make sure you and the fetus are growing normally.   Your blood pressure is taken.   Your abdomen will be  measured to track your baby's growth.   The fetal heartbeat will be listened to.   Any test results from the previous visit will be discussed.   You may have a cervical check near your due date to see if you have effaced.  At around 36 weeks, your caregiver will check your cervix. At the same time, your caregiver will also perform a test on the secretions of the vaginal tissue. This test is to determine if a type of bacteria, Group B streptococcus, is present. Your caregiver will explain this further.  Your caregiver may ask you:   What your birth plan is.   How you are feeling.   If you are feeling the baby move.   If you have had any abnormal symptoms, such as leaking fluid, bleeding, severe headaches, or abdominal cramping.   If you have any questions.  Other tests or screenings that may be performed during your third trimester include:   Blood tests that check for low iron levels (anemia).   Fetal testing to check the health, activity level, and growth of the fetus. Testing is done if you have certain medical conditions or if there are problems during the pregnancy.  FALSE LABOR  You may feel small, irregular contractions that eventually go away. These are called Braxton Hicks contractions, or   false labor. Contractions may last for hours, days, or even weeks before true labor sets in. If contractions come at regular intervals, intensify, or become painful, it is best to be seen by your caregiver.   SIGNS OF LABOR    Menstrual-like cramps.   Contractions that are 5 minutes apart or less.   Contractions that start on the top of the uterus and spread down to the lower abdomen and back.   A sense of increased pelvic pressure or back pain.   A watery or bloody mucus discharge that comes from the vagina.  If you have any of these signs before the 37th week of pregnancy, call your caregiver right away. You need to go to the hospital to get checked immediately.  HOME CARE INSTRUCTIONS    Avoid all  smoking, herbs, alcohol, and unprescribed drugs. These chemicals affect the formation and growth of the baby.   Follow your caregiver's instructions regarding medicine use. There are medicines that are either safe or unsafe to take during pregnancy.   Exercise only as directed by your caregiver. Experiencing uterine cramps is a good sign to stop exercising.   Continue to eat regular, healthy meals.   Wear a good support bra for breast tenderness.   Do not use hot tubs, steam rooms, or saunas.   Wear your seat belt at all times when driving.   Avoid raw meat, uncooked cheese, cat litter boxes, and soil used by cats. These carry germs that can cause birth defects in the baby.   Take your prenatal vitamins.   Try taking a stool softener (if your caregiver approves) if you develop constipation. Eat more high-fiber foods, such as fresh vegetables or fruit and whole grains. Drink plenty of fluids to keep your urine clear or pale yellow.   Take warm sitz baths to soothe any pain or discomfort caused by hemorrhoids. Use hemorrhoid cream if your caregiver approves.   If you develop varicose veins, wear support hose. Elevate your feet for 15 minutes, 3 4 times a day. Limit salt in your diet.   Avoid heavy lifting, wear low heal shoes, and practice good posture.   Rest a lot with your legs elevated if you have leg cramps or low back pain.   Visit your dentist if you have not gone during your pregnancy. Use a soft toothbrush to brush your teeth and be gentle when you floss.   A sexual relationship may be continued unless your caregiver directs you otherwise.   Do not travel far distances unless it is absolutely necessary and only with the approval of your caregiver.   Take prenatal classes to understand, practice, and ask questions about the labor and delivery.   Make a trial run to the hospital.   Pack your hospital bag.   Prepare the baby's nursery.   Continue to go to all your prenatal visits as directed  by your caregiver.  SEEK MEDICAL CARE IF:   You are unsure if you are in labor or if your water has broken.   You have dizziness.   You have mild pelvic cramps, pelvic pressure, or nagging pain in your abdominal area.   You have persistent nausea, vomiting, or diarrhea.   You have a bad smelling vaginal discharge.   You have pain with urination.  SEEK IMMEDIATE MEDICAL CARE IF:    You have a fever.   You are leaking fluid from your vagina.   You have spotting or bleeding from your vagina.     You have severe abdominal cramping or pain.   You have rapid weight loss or gain.   You have shortness of breath with chest pain.   You notice sudden or extreme swelling of your face, hands, ankles, feet, or legs.   You have not felt your baby move in over an hour.   You have severe headaches that do not go away with medicine.   You have vision changes.  Document Released: 11/30/2001 Document Revised: 08/08/2013 Document Reviewed: 02/06/2013  Exit[employer] Patient Information 2014 [employer].

## 2014-04-04 NOTE — MAU Note (Signed)
None     Chief Complaint:  No chief complaint on file.   Rachel Heath is  23 y.o. G3P0020 at 5176w6d presents complaining of excretion of vaginal mucus today. Patient states that she was feeling mild pains earlier today at about 16:00 while at work. Then at 18:00, she states that her mucus plug came out. She denies any LOF or vaginal bleeding. Her pelvic pains are transient in nature lasting just a few seconds and are occuring a couple of times per hour. Normal fetal movement  She denies fevers, N/V, diarrhea, constipation, cp, sob, abdominal pain, urinary and stool changes. Reports +FM.  Obstetrical/Gynecological History: OB History   Grav Para Term Preterm Abortions TAB SAB Ect Mult Living   3 0   2 1 1    0     Past Medical History: Past Medical History  Diagnosis Date  . Ganglion cyst     right hand  . Heart murmur     per parent  . Sickle cell trait     Past Surgical History: Past Surgical History  Procedure Laterality Date  . Hand surgery      Family History: History reviewed. No pertinent family history.  Social History: History  Substance Use Topics  . Smoking status: Never Smoker   . Smokeless tobacco: Not on file  . Alcohol Use: No    Allergies: No Known Allergies  Meds:  Prescriptions prior to admission  Medication Sig Dispense Refill  . calcium carbonate (TUMS - DOSED IN MG ELEMENTAL CALCIUM) 500 MG chewable tablet Chew 2 tablets by mouth daily as needed for indigestion or heartburn.      . Prenatal Vit-Fe Fumarate-FA (PRENATAL MULTIVITAMIN) TABS tablet Take 1 tablet by mouth daily at 12 noon.        Review of Systems -   Review of Systems  See HPI for ROS    Physical Exam  Blood pressure 112/65, pulse 86, temperature 98.4 F (36.9 C), temperature source Oral, resp. rate 20, height 5\' 5"  (1.651 m), weight 87.544 kg (193 lb), last menstrual period 07/20/2013. GENERAL: Well-developed, well-nourished female in no acute distress.  LUNGS: Clear  to auscultation bilaterally.  HEART: Regular rate and rhythm. ABDOMEN: Soft, nontender, nondistended, gravid.  EXTREMITIES: Nontender, no edema, 2+ distal pulses. DTR's 2+ CERVICAL EXAM: Dilatation 1cm   Effacement 50%   Station -3   Presentation: cephalic FHT:  Baseline rate 150 bpm   Variability moderate  Accelerations present   Decelerations none Contractions: None   Labs: No results found for this or any previous visit (from the past 24 hour(s)). Imaging Studies:  No results found.  Assessment: Rachel Heath is  23 y.o. G3P0020 at 8976w6d presents presents with loss of mucous plug  Plan: Rachel Heath is not in active labor.  D/C home. Advised to return to MAU if patient has any LOF, VB, starts having regular contractions occuring every 5 minutes or if she stops feeling FM.Marland Kitchen.  Reassuring and reactive fetus.   Wallis BambergMario Mani 4/16/20159:07 PM  I spoke with and examined patient and agree with PA-S's note and plan of care.  Tawana ScaleMichael Ryan Eilidh Marcano, MD Ob Fellow 04/04/2014 9:19 PM

## 2014-04-04 NOTE — MAU Note (Addendum)
PT SAYS HER MUCUS   PLUG CAME OUT  AT 6 PM .     STARTED FEELING UC AT  4PM WHILE AT WORK-  HURT BAD AT 730PM.   WAS IN OFFICE ON WED- VE -  CLOSED.    DENIES HSV AND MRSA.   LAST SEX-   MON.  SAYS SHE WAS HERE  2 DAYS AGO  FOR   H/A-- NO H/A NOW,

## 2014-04-07 NOTE — MAU Provider Note (Signed)
Attestation of Attending Supervision of Fellow: Evaluation and management procedures were performed by the Fellow under my supervision and collaboration.  I have reviewed the Fellow's note and chart, and I agree with the management and plan.    

## 2014-04-19 ENCOUNTER — Encounter (HOSPITAL_COMMUNITY): Payer: Self-pay | Admitting: *Deleted

## 2014-04-19 ENCOUNTER — Inpatient Hospital Stay (HOSPITAL_COMMUNITY)
Admission: AD | Admit: 2014-04-19 | Discharge: 2014-04-20 | DRG: 775 | Disposition: A | Discharge status: Home / Self Care | Payer: Medicaid Other | Source: Ambulatory Visit | Attending: Obstetrics and Gynecology | Admitting: Obstetrics and Gynecology

## 2014-04-19 ENCOUNTER — Inpatient Hospital Stay (HOSPITAL_COMMUNITY): Payer: Medicaid Other | Admitting: Anesthesiology

## 2014-04-19 ENCOUNTER — Encounter (HOSPITAL_COMMUNITY): Payer: Medicaid Other | Admitting: Anesthesiology

## 2014-04-19 ENCOUNTER — Inpatient Hospital Stay (HOSPITAL_COMMUNITY)
Admission: AD | Admit: 2014-04-19 | Discharge: 2014-04-19 | Disposition: A | Discharge status: Home / Self Care | Payer: Medicaid Other | Source: Ambulatory Visit | Attending: Obstetrics & Gynecology | Admitting: Obstetrics & Gynecology

## 2014-04-19 DIAGNOSIS — O99891 Other specified diseases and conditions complicating pregnancy: Secondary | ICD-10-CM | POA: Insufficient documentation

## 2014-04-19 DIAGNOSIS — IMO0001 Reserved for inherently not codable concepts without codable children: Secondary | ICD-10-CM

## 2014-04-19 DIAGNOSIS — O99019 Anemia complicating pregnancy, unspecified trimester: Secondary | ICD-10-CM | POA: Insufficient documentation

## 2014-04-19 DIAGNOSIS — R011 Cardiac murmur, unspecified: Secondary | ICD-10-CM | POA: Insufficient documentation

## 2014-04-19 DIAGNOSIS — O9989 Other specified diseases and conditions complicating pregnancy, childbirth and the puerperium: Secondary | ICD-10-CM

## 2014-04-19 DIAGNOSIS — D573 Sickle-cell trait: Secondary | ICD-10-CM

## 2014-04-19 DIAGNOSIS — O479 False labor, unspecified: Secondary | ICD-10-CM | POA: Insufficient documentation

## 2014-04-19 DIAGNOSIS — O9902 Anemia complicating childbirth: Secondary | ICD-10-CM

## 2014-04-19 DIAGNOSIS — M6740 Ganglion, unspecified site: Secondary | ICD-10-CM | POA: Insufficient documentation

## 2014-04-19 LAB — CBC
HCT: 32.3 % — ABNORMAL LOW (ref 36.0–46.0)
HEMOGLOBIN: 11.3 g/dL — AB (ref 12.0–15.0)
MCH: 29.1 pg (ref 26.0–34.0)
MCHC: 35 g/dL (ref 30.0–36.0)
MCV: 83.2 fL (ref 78.0–100.0)
PLATELETS: 284 10*3/uL (ref 150–400)
RBC: 3.88 MIL/uL (ref 3.87–5.11)
RDW: 12.7 % (ref 11.5–15.5)
WBC: 13.3 10*3/uL — ABNORMAL HIGH (ref 4.0–10.5)

## 2014-04-19 LAB — RPR

## 2014-04-19 MED ORDER — LANOLIN HYDROUS EX OINT: TOPICAL_OINTMENT | CUTANEOUS | Status: DC | PRN

## 2014-04-19 MED ORDER — DIBUCAINE 1 % RE OINT: 1.0000 "application " | TOPICAL_OINTMENT | RECTAL | Status: DC | PRN

## 2014-04-19 MED ORDER — EPHEDRINE 5 MG/ML INJ
10.0000 mg | INTRAVENOUS | Status: DC | PRN
  Filled 2014-04-19: qty 2

## 2014-04-19 MED ORDER — OXYTOCIN BOLUS FROM INFUSION: 500.0000 mL | INTRAVENOUS | Status: DC

## 2014-04-19 MED ORDER — ONDANSETRON HCL 4 MG PO TABS: 4.0000 mg | ORAL_TABLET | ORAL | Status: DC | PRN

## 2014-04-19 MED ORDER — CITRIC ACID-SODIUM CITRATE 334-500 MG/5ML PO SOLN: 30.0000 mL | ORAL | Status: DC | PRN

## 2014-04-19 MED ORDER — IBUPROFEN 600 MG PO TABS: 600.0000 mg | ORAL_TABLET | Freq: Four times a day (QID) | ORAL | Status: DC | PRN

## 2014-04-19 MED ORDER — DIPHENHYDRAMINE HCL 50 MG/ML IJ SOLN: 12.5000 mg | INTRAMUSCULAR | Status: DC | PRN

## 2014-04-19 MED ORDER — SENNOSIDES-DOCUSATE SODIUM 8.6-50 MG PO TABS
2.0000 | ORAL_TABLET | ORAL | Status: DC
  Administered 2014-04-19: 2 via ORAL
  Filled 2014-04-19: qty 2

## 2014-04-19 MED ORDER — OXYCODONE-ACETAMINOPHEN 5-325 MG PO TABS
1.0000 | ORAL_TABLET | ORAL | Status: DC | PRN
  Administered 2014-04-20 (×2): 1 via ORAL
  Filled 2014-04-19 (×2): qty 1

## 2014-04-19 MED ORDER — EPHEDRINE 5 MG/ML INJ
INTRAVENOUS | Status: AC
  Filled 2014-04-19: qty 4

## 2014-04-19 MED ORDER — PHENYLEPHRINE 40 MCG/ML (10ML) SYRINGE FOR IV PUSH (FOR BLOOD PRESSURE SUPPORT)
80.0000 ug | PREFILLED_SYRINGE | INTRAVENOUS | Status: DC | PRN
  Filled 2014-04-19: qty 2

## 2014-04-19 MED ORDER — LACTATED RINGERS IV SOLN
500.0000 mL | Freq: Once | INTRAVENOUS | Status: AC
  Administered 2014-04-19: 500 mL via INTRAVENOUS

## 2014-04-19 MED ORDER — IBUPROFEN 600 MG PO TABS
600.0000 mg | ORAL_TABLET | Freq: Four times a day (QID) | ORAL | Status: DC
  Administered 2014-04-19 – 2014-04-20 (×4): 600 mg via ORAL
  Filled 2014-04-19 (×4): qty 1

## 2014-04-19 MED ORDER — ACETAMINOPHEN 325 MG PO TABS: 650.0000 mg | ORAL_TABLET | ORAL | Status: DC | PRN

## 2014-04-19 MED ORDER — FENTANYL 2.5 MCG/ML BUPIVACAINE 1/10 % EPIDURAL INFUSION (WH - ANES): 14.0000 mL/h | INTRAMUSCULAR | Status: DC | PRN

## 2014-04-19 MED ORDER — OXYTOCIN 40 UNITS IN LACTATED RINGERS INFUSION - SIMPLE MED
62.5000 mL/h | INTRAVENOUS | Status: DC
  Administered 2014-04-19: 999 mL/h via INTRAVENOUS
  Filled 2014-04-19: qty 1000

## 2014-04-19 MED ORDER — FENTANYL 2.5 MCG/ML BUPIVACAINE 1/10 % EPIDURAL INFUSION (WH - ANES)
INTRAMUSCULAR | Status: AC
  Filled 2014-04-19: qty 125

## 2014-04-19 MED ORDER — PHENYLEPHRINE 40 MCG/ML (10ML) SYRINGE FOR IV PUSH (FOR BLOOD PRESSURE SUPPORT)
PREFILLED_SYRINGE | INTRAVENOUS | Status: AC
  Filled 2014-04-19: qty 10

## 2014-04-19 MED ORDER — ONDANSETRON HCL 4 MG/2ML IJ SOLN: 4.0000 mg | INTRAMUSCULAR | Status: DC | PRN

## 2014-04-19 MED ORDER — LIDOCAINE HCL (PF) 1 % IJ SOLN
30.0000 mL | INTRAMUSCULAR | Status: DC | PRN
  Filled 2014-04-19: qty 30

## 2014-04-19 MED ORDER — SIMETHICONE 80 MG PO CHEW: 80.0000 mg | CHEWABLE_TABLET | ORAL | Status: DC | PRN

## 2014-04-19 MED ORDER — LACTATED RINGERS IV SOLN: INTRAVENOUS | Status: DC

## 2014-04-19 MED ORDER — FLEET ENEMA 7-19 GM/118ML RE ENEM: 1.0000 | ENEMA | RECTAL | Status: DC | PRN

## 2014-04-19 MED ORDER — LACTATED RINGERS IV SOLN
500.0000 mL | INTRAVENOUS | Status: DC | PRN
  Administered 2014-04-19: 1000 mL via INTRAVENOUS

## 2014-04-19 MED ORDER — ONDANSETRON HCL 4 MG/2ML IJ SOLN: 4.0000 mg | Freq: Four times a day (QID) | INTRAMUSCULAR | Status: DC | PRN

## 2014-04-19 MED ORDER — OXYCODONE-ACETAMINOPHEN 5-325 MG PO TABS: 1.0000 | ORAL_TABLET | ORAL | Status: DC | PRN

## 2014-04-19 MED ORDER — BENZOCAINE-MENTHOL 20-0.5 % EX AERO
1.0000 "application " | INHALATION_SPRAY | CUTANEOUS | Status: DC | PRN
  Administered 2014-04-20: 1 via TOPICAL
  Filled 2014-04-19: qty 56

## 2014-04-19 MED ORDER — TETANUS-DIPHTH-ACELL PERTUSSIS 5-2.5-18.5 LF-MCG/0.5 IM SUSP: 0.5000 mL | Freq: Once | INTRAMUSCULAR | Status: DC

## 2014-04-19 MED ORDER — ZOLPIDEM TARTRATE 5 MG PO TABS: 5.0000 mg | ORAL_TABLET | Freq: Every evening | ORAL | Status: DC | PRN

## 2014-04-19 MED ORDER — DIPHENHYDRAMINE HCL 25 MG PO CAPS
25.0000 mg | ORAL_CAPSULE | Freq: Four times a day (QID) | ORAL | Status: DC | PRN
Start: 2014-04-19 — End: 2014-04-21

## 2014-04-19 MED ORDER — WITCH HAZEL-GLYCERIN EX PADS: 1.0000 "application " | MEDICATED_PAD | CUTANEOUS | Status: DC | PRN

## 2014-04-19 MED ORDER — PRENATAL MULTIVITAMIN CH
1.0000 | ORAL_TABLET | Freq: Every day | ORAL | Status: DC
  Administered 2014-04-20: 1 via ORAL
  Filled 2014-04-19: qty 1

## 2014-04-19 NOTE — MAU Note (Signed)
HAD TO GET PT FROM  CAR AT DOOR- UNCOMFORTABLE-  UC  HURT BAD    AT 0100  .  DENIES SROM, NO VAG BLEEDING.  DENIES HSV AND MRSA.  GETS PNC  AT [facility].

## 2014-04-19 NOTE — H&P (Signed)
Rachel Heath is a 23 y.o. female G3P0020 with IUP at 5152w0d presenting for SOL.   Patient complaining of regular frequent painful contractions q 2-3 min. Reported leakage of clear fluid around 1130.  Admit to good FM. Denies any vaginal bleeding.  [facility]ren[facility]/Com[facility] Mom is O (negative) and Father is O (positive), received RhoGam prophylaxis. Other complications include obesity, anemia in pregnancy. - Maternal heart murmur investigated during pregnancy ([facility]), with ECHO (normal, 10/2013).  Past Medical History: Past Medical History  Diagnosis Date  . Ganglion cyst     right hand  . Heart murmur     per parent  . Sickle cell trait     Past Surgical History: Past Surgical History  Procedure Laterality Date  . Hand surgery      Obstetrical History: OB History   Grav Para Term Preterm Abortions TAB SAB Ect Mult Living   3 0   2 1 1    0     Social History: History   Social History  . Marital Status: Single    Spouse Name: N/A    Number of Children: N/A  . Years of Education: N/A   Social History Main Topics  . Smoking status: Never Smoker   . Smokeless tobacco: None  . Alcohol Use: No  . Drug Use: No  . Sexual Activity: Yes    Birth Control/ Protection: None     Comment: last depo shot 11/2011   Other Topics Concern  . None   Social History Narrative  . None    Family History: History reviewed. No pertinent family history.  Allergies: No Known Allergies  Prescriptions prior to admission  Medication Sig Dispense Refill  . calcium carbonate (TUMS - DOSED IN MG ELEMENTAL CALCIUM) 500 MG chewable tablet Chew 2 tablets by mouth daily as needed for indigestion or heartburn.      . Prenatal Vit-Fe Fumarate-FA (PRENATAL MULTIVITAMIN) TABS tablet Take 1 tablet by mouth daily at 12 noon.        Review of Systems: Negative unless otherwise stated in History above  Physicial Blood pressure 137/88, pulse  90, temperature 98.3 F (36.8 C), temperature source Oral, resp. rate 20, height 5\' 5"  (1.651 m), weight 87.544 kg (193 lb), last menstrual period 07/20/2013. General appearance: alert and cooperative, uncomfortable with each ctx Lungs: clear to auscultation bilaterally Heart: RRR Abdomen: soft, appropriately gravid for GA; bowel sounds normal Extremities: Homans sign is negative, no sign of DVT DTR's +2 Presentation: cephalic on exam Fetal monitoringBaseline: 140 bpm, moderate variability, accels present (15x15), no decels Uterine activity: regular UCs q 2-3 min  Dilation: 8 Effacement (%): 100 Station: -3 Exam by:: Morrison Oldee Carter RN  Prenatal labs: ABO, Rh: O/Negative/-- (10/27 0000) Antibody: Positive (10/27 0000) Rubella:   immune RPR: Nonreactive (10/27 0000)  HBsAg: Negative (10/27 0000)  HIV: Non-reactive (10/27 0000)  GBS: Negative (04/15 0000)  GTT: 1 hr screen nml, 97  Prenatal Transfer Tool  Maternal Diabetes: No Genetic Screening: Normal Maternal Ultrasounds/Referrals: Normal Fetal Ultrasounds or other Referrals:  None Maternal Substance Abuse:  No Significant Maternal Medications:  None Significant Maternal Lab Results: Lab values include: Group B Strep negative, Rh negative  No results found for this or any previous visit (from the past 24 hour(s)).  Assessment: Rachel Heath is a 23 y.o. G3P0020 at 7252w0d presented to MAU for labor eval. Admitted for SOL with suspected SROM. Significant cervical progress from last check 0300 around 3cm,  last check at 6cm dilation.  #Labor: Expectant management, SOL, suspected SROM. Continue to monitor, augment with Pitocin as needed, currently with adequate contractions. #Pain: Epidural #FWB: CAT-1 #ID:  GBS (negative) #Feeding: Bottle feeding #MOC:Mirena IUD #Circ:  Desired in hospital but does not know options  Saralyn PilarAlexander Karamalegos, DO [facility], PGY-1  04/19/2014, 1:48 PM   I have seen and examined  this patient and agree with above documentation in the resident's note.   Rulon AbideKeli Kourtnee Lahey, M.D. [facility] 04/19/2014 4:41 PM

## 2014-04-19 NOTE — MAU Provider Note (Signed)
  Chief Complaint:  Labor check  Rachel Pesayeasha M Erdman is  23 y.o. G3P0020 at 5342w0d presents complaining of contractions which have become progressively stronger and more frequent this morning. Pt has not timed them. No vaginal bleeding, no LOF, no change in vaginal discharge.  Positive fetal movement.  [facility]    Obstetrical/Gynecological History: OB History   Grav Para Term Preterm Abortions TAB SAB Ect Mult Living   3 0   2 1 1    0     Past Medical History: Past Medical History  Diagnosis Date  . Ganglion cyst     right hand  . Heart murmur     per parent  . Sickle cell trait     Past Surgical History: Past Surgical History  Procedure Laterality Date  . Hand surgery      Family History: History reviewed. No pertinent family history.  Social History: History  Substance Use Topics  . Smoking status: Never Smoker   . Smokeless tobacco: Not on file  . Alcohol Use: No    Allergies: No Known Allergies  Meds:  Prescriptions prior to admission  Medication Sig Dispense Refill  . calcium carbonate (TUMS - DOSED IN MG ELEMENTAL CALCIUM) 500 MG chewable tablet Chew 2 tablets by mouth daily as needed for indigestion or heartburn.      . Prenatal Vit-Fe Fumarate-FA (PRENATAL MULTIVITAMIN) TABS tablet Take 1 tablet by mouth daily at 12 noon.        Review of Systems -   Review of Systems  HEENT: no headache or visual changes CV: No chest pain Resp: no dyspnea Abdomen: + Ctx, +fetal movement Ext: no edema    Physical Exam  Blood pressure 140/89, pulse 76, last menstrual period 07/20/2013. GENERAL: Well-developed, well-nourished female in no acute distress.  LUNGS: Clear to auscultation bilaterally.  HEART: Regular rate and rhythm, systolic murmur. ABDOMEN: Soft, nontender, nondistended, gravid, cephalic.  EXTREMITIES: Nontender, no edema, 2+ distal pulses. CERVICAL EXAM: 3cm/70%/-2 Presentation:  cephalic FHT:  Baseline rate 140s, + accels, moderate variability, no decels. Category I tracing Contractions: every 1-4 minutes   Labs: No results found for this or any previous visit (from the past 24 hour(s)). Imaging Studies:  No results found.  Assessment: Rachel Pesayeasha M Heath is  23 y.o. G3P0020 at 4742w0d presents with contractions. Initial and repeat cervical check at 1.5 hours unchanged at 3cm, Category I tracing - Latent phase of Labor  Plan: 1. Discharge home 2. Return to clinic signs given  Myriam JacobsonRobyn H Restrepo 5/1/20154:40 AM  I have seen and examined this patient and agree the above assessment. Jacklyn ShellFrances Cresenzo-Dishmon 04/19/2014 7:36 AM

## 2014-04-19 NOTE — Progress Notes (Signed)
2045 Removed epidural catheter intact. Site clear, no bleeding. Unable to chart on flowsheet, notified anesthesia. Rosebud PolesGloria Hennessey Cantrell ,RN

## 2014-04-19 NOTE — Anesthesia Preprocedure Evaluation (Signed)
Anesthesia Evaluation  Patient identified by MRN, date of birth, ID band Patient awake    Reviewed: Allergy & Precautions, H&P , NPO status , Patient's Chart, lab work & pertinent test results  Airway Mallampati: II TM Distance: >3 FB Neck ROM: full    Dental no notable dental hx.    Pulmonary neg pulmonary ROS,    Pulmonary exam normal       Cardiovascular negative cardio ROS      Neuro/Psych negative neurological ROS  negative psych ROS   GI/Hepatic negative GI ROS, Neg liver ROS,   Endo/Other  negative endocrine ROS  Renal/GU negative Renal ROS     Musculoskeletal   Abdominal Normal abdominal exam  (+)   Peds  Hematology negative hematology ROS (+)   Anesthesia Other Findings   Reproductive/Obstetrics (+) Pregnancy                           Anesthesia Physical Anesthesia Plan  ASA: II  Anesthesia Plan: Epidural   Post-op Pain Management:    Induction:   Airway Management Planned:   Additional Equipment:   Intra-op Plan:   Post-operative Plan:   Informed Consent: I have reviewed the patients History and Physical, chart, labs and discussed the procedure including the risks, benefits and alternatives for the proposed anesthesia with the patient or authorized representative who has indicated his/her understanding and acceptance.     Plan Discussed with:   Anesthesia Plan Comments:         Anesthesia Quick Evaluation  

## 2014-04-19 NOTE — H&P (Signed)
Attestation of Attending Supervision of Advanced Practitioner (CNM/NP): Evaluation and management procedures were performed by the Advanced Practitioner under my supervision and collaboration.  I have reviewed the Advanced Practitioner's note and chart, and I agree with the management and plan.  Leanda Padmore 04/19/2014 7:48 PM

## 2014-04-19 NOTE — Discharge Instructions (Signed)

## 2014-04-19 NOTE — MAU Note (Signed)
Was here during the night, was 3 cm.  Water broke about ago.  ? Sac is hanging out. Taken directly to rm

## 2014-04-20 LAB — CBC
HEMATOCRIT: 26.6 % — AB (ref 36.0–46.0)
HEMOGLOBIN: 9.3 g/dL — AB (ref 12.0–15.0)
MCH: 29.2 pg (ref 26.0–34.0)
MCHC: 35 g/dL (ref 30.0–36.0)
MCV: 83.4 fL (ref 78.0–100.0)
Platelets: 235 10*3/uL (ref 150–400)
RBC: 3.19 MIL/uL — ABNORMAL LOW (ref 3.87–5.11)
RDW: 12.8 % (ref 11.5–15.5)
WBC: 16.1 10*3/uL — ABNORMAL HIGH (ref 4.0–10.5)

## 2014-04-20 MED ORDER — RHO D IMMUNE GLOBULIN 1500 UNIT/2ML IJ SOSY
300.0000 ug | PREFILLED_SYRINGE | Freq: Once | INTRAMUSCULAR | Status: AC
  Administered 2014-04-20: 300 ug via INTRAMUSCULAR
  Filled 2014-04-20: qty 2

## 2014-04-20 MED ORDER — IBUPROFEN 600 MG PO TABS: 600.0000 mg | ORAL_TABLET | Freq: Four times a day (QID) | ORAL | Status: DC

## 2014-04-20 NOTE — Discharge Instructions (Signed)

## 2014-04-20 NOTE — Discharge Summary (Signed)
Obstetric Discharge Summary Reason for Admission: onset of labor Prenatal Procedures: none Intrapartum Procedures: spontaneous vaginal delivery Postpartum Procedures: none Complications-Operative and Postpartum: none Hemoglobin  Date Value Ref Range Status  04/20/2014 9.3* 12.0 - 15.0 g/dL Final     REPEATED TO VERIFY     DELTA CHECK NOTED     HCT  Date Value Ref Range Status  04/20/2014 26.6* 36.0 - 46.0 % Final   [name] is a 23yo G3P0020 admitted at 39.0wks on 04/19/14. She was in active labor and progressed in short time to SVD. By PPD#1 she is doing well and has been deemed to have received the full benefit of her hospital stay and requests discharge. She is bottlefeeding and desires the Paragard for contraception.  Physical Exam:  General: alert, cooperative and no distress Heart: RRR Lungs: nl effort Lochia: appropriate Uterine Fundus: firm DVT Evaluation: No evidence of DVT seen on physical exam.  Discharge Diagnoses: Term Pregnancy-delivered  Discharge Information: Date: 04/20/2014 Activity: pelvic rest Diet: routine Medications: PNV and Ibuprofen Condition: stable Instructions: refer to practice specific booklet Discharge to: home Follow-up Information   Follow up with [facility] [place] In 4 weeks. (For your postpartum appointment.)    Contact information:   [address] [place] [address] [phone]      Newborn Data: Live born female  Birth Weight: 6 lb 8 oz (2948 g) APGAR: 9, 9  Home with mother.  Rachel Heath 04/20/2014, 8:39 AM

## 2014-04-20 NOTE — Anesthesia Postprocedure Evaluation (Signed)
Anesthesia Post Note  Patient: Rachel Heath  Procedure(s) Performed: * No procedures listed *  Anesthesia type: Epidural  Patient location: Mother/Baby  Post pain: Pain level controlled  Post assessment: Post-op Vital signs reviewed  Last Vitals:  Filed Vitals:   04/20/14 0516  BP: 109/67  Pulse: 76  Temp: 36.8 C  Resp: 17    Post vital signs: Reviewed  Level of consciousness:alert  Complications: No apparent anesthesia complications

## 2014-04-21 LAB — RH IG WORKUP (INCLUDES ABO/RH)
ABO/RH(D): O NEG
Antibody Screen: NEGATIVE
Fetal Screen: NEGATIVE
GESTATIONAL AGE(WKS): 39
Unit division: 0

## 2014-04-22 NOTE — Progress Notes (Signed)
Ur chart review completed.  

## 2014-04-22 NOTE — Discharge Summary (Signed)
Attestation of Attending Supervision of Advanced Practitioner (CNM/NP): Evaluation and management procedures were performed by the Advanced Practitioner under my supervision and collaboration.  I have reviewed the Advanced Practitioner's note and chart, and I agree with the management and plan.  Jameil Whitmoyer 04/22/2014 7:19 AM

## 2014-04-22 NOTE — MAU Provider Note (Signed)
Attestation of Attending Supervision of Advanced Practitioner (CNM/NP): Evaluation and management procedures were performed by the Advanced Practitioner under my supervision and collaboration. I have reviewed the Advanced Practitioner's note and chart, and I agree with the management and plan.  Lesly DukesKelly H Makih Stefanko 8:35 AM

## 2014-05-01 MED ORDER — FENTANYL 2.5 MCG/ML BUPIVACAINE 1/10 % EPIDURAL INFUSION (WH - ANES)
INTRAMUSCULAR | Status: DC | PRN
  Administered 2014-04-19: 14 mL/h via EPIDURAL

## 2014-05-01 MED ORDER — LIDOCAINE HCL (PF) 1 % IJ SOLN
INTRAMUSCULAR | Status: DC | PRN
  Administered 2014-04-19 (×2): 8 mL

## 2014-05-01 NOTE — Anesthesia Procedure Notes (Signed)
Epidural Patient location during procedure: OB Start time: 04/19/2014 2:21 PM End time: 04/19/2014 2:25 PM  Staffing Anesthesiologist: Leilani AbleHATCHETT, Zymarion Favorite Performed by: anesthesiologist   Preanesthetic Checklist Completed: patient identified, surgical consent, pre-op evaluation, timeout performed, IV checked, risks and benefits discussed and monitors and equipment checked  Epidural Patient position: sitting Prep: site prepped and draped and DuraPrep Patient monitoring: continuous pulse ox and blood pressure Approach: midline Location: L3-L4 Injection technique: LOR air  Needle:  Needle type: Tuohy  Needle gauge: 17 G Needle length: 9 cm and 9 Needle insertion depth: 5 cm cm Catheter type: closed end flexible Catheter size: 19 Gauge Catheter at skin depth: 10 cm Test dose: negative and Other  Assessment Sensory level: T9 Events: blood not aspirated, injection not painful, no injection resistance, negative IV test and no paresthesia  Additional Notes Reason for block:procedure for pain

## 2014-07-04 ENCOUNTER — Encounter (HOSPITAL_COMMUNITY): Payer: Self-pay | Admitting: Emergency Medicine

## 2014-07-04 ENCOUNTER — Emergency Department (HOSPITAL_COMMUNITY): Payer: Medicaid Other

## 2014-07-04 ENCOUNTER — Emergency Department (HOSPITAL_COMMUNITY)
Admission: EM | Admit: 2014-07-04 | Discharge: 2014-07-04 | Disposition: A | Discharge status: Home / Self Care | Payer: Medicaid Other | Attending: Emergency Medicine | Admitting: Emergency Medicine

## 2014-07-04 DIAGNOSIS — Z79899 Other long term (current) drug therapy: Secondary | ICD-10-CM | POA: Diagnosis not present

## 2014-07-04 DIAGNOSIS — Z862 Personal history of diseases of the blood and blood-forming organs and certain disorders involving the immune mechanism: Secondary | ICD-10-CM | POA: Diagnosis not present

## 2014-07-04 DIAGNOSIS — R011 Cardiac murmur, unspecified: Secondary | ICD-10-CM | POA: Insufficient documentation

## 2014-07-04 DIAGNOSIS — Z8739 Personal history of other diseases of the musculoskeletal system and connective tissue: Secondary | ICD-10-CM | POA: Insufficient documentation

## 2014-07-04 DIAGNOSIS — Z3202 Encounter for pregnancy test, result negative: Secondary | ICD-10-CM | POA: Diagnosis not present

## 2014-07-04 DIAGNOSIS — Z87891 Personal history of nicotine dependence: Secondary | ICD-10-CM | POA: Insufficient documentation

## 2014-07-04 DIAGNOSIS — R0609 Other forms of dyspnea: Secondary | ICD-10-CM | POA: Diagnosis present

## 2014-07-04 DIAGNOSIS — J209 Acute bronchitis, unspecified: Secondary | ICD-10-CM | POA: Diagnosis not present

## 2014-07-04 LAB — URINALYSIS, ROUTINE W REFLEX MICROSCOPIC
Bilirubin Urine: NEGATIVE
Glucose, UA: NEGATIVE mg/dL
Hgb urine dipstick: NEGATIVE
Ketones, ur: NEGATIVE mg/dL
Leukocytes, UA: NEGATIVE
Nitrite: NEGATIVE
Protein, ur: NEGATIVE mg/dL
Specific Gravity, Urine: 1.009 (ref 1.005–1.030)
Urobilinogen, UA: 1 mg/dL (ref 0.0–1.0)
pH: 7.5 (ref 5.0–8.0)

## 2014-07-04 LAB — COMPREHENSIVE METABOLIC PANEL
ALT: 11 U/L (ref 0–35)
AST: 22 U/L (ref 0–37)
Albumin: 3.7 g/dL (ref 3.5–5.2)
Alkaline Phosphatase: 74 U/L (ref 39–117)
Anion gap: 15 (ref 5–15)
BUN: 8 mg/dL (ref 6–23)
CALCIUM: 8.7 mg/dL (ref 8.4–10.5)
CO2: 25 mEq/L (ref 19–32)
CREATININE: 0.7 mg/dL (ref 0.50–1.10)
Chloride: 106 mEq/L (ref 96–112)
GFR calc Af Amer: >90 mL/min (ref >90)
Glucose, Bld: 164 mg/dL — ABNORMAL HIGH (ref 70–99)
Potassium: 3.4 mEq/L — ABNORMAL LOW (ref 3.7–5.3)
Sodium: 146 mEq/L (ref 137–147)
TOTAL PROTEIN: 7.1 g/dL (ref 6.0–8.3)
Total Bilirubin: 0.4 mg/dL (ref 0.3–1.2)

## 2014-07-04 LAB — CBC
HCT: 34.6 % — ABNORMAL LOW (ref 36.0–46.0)
Hemoglobin: 11.6 g/dL — ABNORMAL LOW (ref 12.0–15.0)
MCH: 27.8 pg (ref 26.0–34.0)
MCHC: 33.5 g/dL (ref 30.0–36.0)
MCV: 82.8 fL (ref 78.0–100.0)
Platelets: 304 10*3/uL (ref 150–400)
RBC: 4.18 MIL/uL (ref 3.87–5.11)
RDW: 13.5 % (ref 11.5–15.5)
WBC: 10.7 10*3/uL — ABNORMAL HIGH (ref 4.0–10.5)

## 2014-07-04 LAB — LIPASE, BLOOD: Lipase: 33 U/L (ref 11–59)

## 2014-07-04 LAB — POC URINE PREG, ED: Preg Test, Ur: NEGATIVE

## 2014-07-04 LAB — D-DIMER, QUANTITATIVE (NOT AT ARMC)

## 2014-07-04 MED ORDER — PREDNISONE 50 MG PO TABS: 50.0000 mg | ORAL_TABLET | Freq: Every day | ORAL | Status: DC

## 2014-07-04 MED ORDER — ALBUTEROL SULFATE HFA 108 (90 BASE) MCG/ACT IN AERS
1.0000 | INHALATION_SPRAY | RESPIRATORY_TRACT | Status: DC | PRN
  Administered 2014-07-04: 2 via RESPIRATORY_TRACT
  Filled 2014-07-04: qty 6.7

## 2014-07-04 NOTE — ED Notes (Addendum)
23 yo female via [facility] with Resp Distress/Asthma pt was found on the floor in the home after ambulating to the bathroom. Per EMS lower lung bases absent with expirator wheezes. Pt received 2 (0.5mg ) ablbutoral tx in route. No hx of asthma but just started back smoking recently. Denies fever/n/v/d. Alert Oriented.

## 2014-07-04 NOTE — ED Provider Notes (Signed)
CSN: [id number]     Arrival date & time    History   First MD Initiated Contact with Patient 07/04/14 0724     Chief Complaint  Patient presents with  . Respiratory Distress  . Asthma    HPI Patient presents to the emergency room with complaints of shortness of breath. She woke up this morning initially with some discomfort in her abdominal area where she felt like she had a lot of gas. She also had some discomfort in her back. She then became short of breath. Patient sat down on the bathroom floor. She couldn't catch her breath and felt lightheaded. Patient called 911. When EMS arrived they noted that she was having some respiratory difficulty and was wheezing. EMS treated her with 2 breathing treatments. Patient has noticed some improvement but does still feel short of breath. No history of asthma or wheezing. She does smoke. She has not noticed any leg swelling. She does not have any history of DVT or PE. Family history is only significant for hypertension. Patient does not take any birth control medication  Past Medical History  Diagnosis Date  . Ganglion cyst     right hand  . Heart murmur     per parent  . Sickle cell trait    Past Surgical History  Procedure Laterality Date  . Hand surgery     No family history on file. History  Substance Use Topics  . Smoking status: Light Tobacco Smoker  . Smokeless tobacco: Not on file  . Alcohol Use: No   OB History   Grav Para Term Preterm Abortions TAB SAB Ect Mult Living   3 1 1  2 1 1   1      Review of Systems  All other systems reviewed and are negative.     Allergies  Review of patient's allergies indicates no known allergies.  Home Medications   Prior to Admission medications   Medication Sig Start Date End Date Taking? Authorizing Provider  acetaminophen (TYLENOL) 500 MG tablet Take 1,000 mg by mouth every 6 (six) hours as needed for mild pain.   Yes Historical Provider, MD  calcium carbonate (TUMS EX) 750 MG  chewable tablet Chew 1 tablet by mouth daily as needed for heartburn.   Yes Historical Provider, MD   BP 144/70  Pulse 94  Temp(Src) 97.7 F (36.5 C) (Oral)  Resp 17  SpO2 100%  LMP 06/30/2014 Physical Exam  Nursing note and vitals reviewed. Constitutional: She appears well-developed and well-nourished. No distress.  HENT:  Head: Normocephalic and atraumatic.  Right Ear: External ear normal.  Left Ear: External ear normal.  Eyes: Conjunctivae are normal. Right eye exhibits no discharge. Left eye exhibits no discharge. No scleral icterus.  Neck: Neck supple. No tracheal deviation present.  Cardiovascular: Normal rate, regular rhythm and intact distal pulses.   Pulmonary/Chest: Effort normal and breath sounds normal. No stridor. No respiratory distress. She has no wheezes. She has no rales.  Abdominal: Soft. Bowel sounds are normal. She exhibits no distension. There is no tenderness. There is no rebound and no guarding.  Musculoskeletal: She exhibits no edema and no tenderness.  Neurological: She is alert. She has normal strength. No cranial nerve deficit (no facial droop, extraocular movements intact, no slurred speech) or sensory deficit. She exhibits normal muscle tone. She displays no seizure activity. Coordination normal.  Skin: Skin is warm and dry. No rash noted.  Psychiatric: She has a normal mood and affect.  ED Course  Procedures (including critical care time) Labs Review Labs Reviewed  CBC - Abnormal; Notable for the following:    WBC 10.7 (*)    Hemoglobin 11.6 (*)    HCT 34.6 (*)    All other components within normal limits  COMPREHENSIVE METABOLIC PANEL - Abnormal; Notable for the following:    Potassium 3.4 (*)    Glucose, Bld 164 (*)    All other components within normal limits  D-DIMER, QUANTITATIVE  LIPASE, BLOOD  URINALYSIS, ROUTINE W REFLEX MICROSCOPIC  POC URINE PREG, ED    Imaging Review Dg Chest 2 View  07/04/2014   CLINICAL DATA:  Respiratory  distress.  EXAM: CHEST  2 VIEW  COMPARISON:  None.  FINDINGS: Mediastinum and hilar structures normal. The lungs are clear. Heart size normal. No pleural effusion or pneumothorax. No acute bony abnormality.  IMPRESSION: No active cardiopulmonary disease.   Electronically Signed   By: Maisie Fushomas  Register   On: 07/04/2014 08:07     EKG Interpretation   Date/Time:  Thursday July 04 2014 07:37:42 EDT Ventricular Rate:  89 PR Interval:  119 QRS Duration: 89 QT Interval:  382 QTC Calculation: 465 R Axis:   13 Text Interpretation:  Sinus rhythm Borderline short PR interval No  previous tracing Confirmed by Kyser Wandel  MD-J, Brett Soza ([id number]) on 07/04/2014  11:20:08 AM      1140  Pt feels well..  No dyspnea MDM   Final diagnoses:  Bronchitis, acute, with bronchospasm    Suspect bronchitis with bronchospasm.  No wheezing on my exam but was treated by EMS.  Doubt PE. No pna on cxr.  Dc home with steroids and inhaler.  Stop smoking   Linwood DibblesJon Demitri Kucinski, MD 07/04/14 1151

## 2014-07-04 NOTE — Discharge Instructions (Signed)
Bronchitis  Bronchitis is inflammation of the airways that extend from the windpipe into the lungs (bronchi). The inflammation often causes mucus to develop, which leads to a cough. If the inflammation becomes severe, it may cause shortness of breath.  CAUSES   Bronchitis may be caused by:    Viral infections.    Bacteria.    Cigarette smoke.    Allergens, pollutants, and other irritants.   SIGNS AND SYMPTOMS   The most common symptom of bronchitis is a frequent cough that produces mucus. Other symptoms include:   Fever.    Body aches.    Chest congestion.    Chills.    Shortness of breath.    Sore throat.   DIAGNOSIS   Bronchitis is usually diagnosed through a medical history and physical exam. Tests, such as chest X-rays, are sometimes done to rule out other conditions.   TREATMENT   You may need to avoid contact with whatever caused the problem (smoking, for example). Medicines are sometimes needed. These may include:   Antibiotics. These may be prescribed if the condition is caused by bacteria.   Cough suppressants. These may be prescribed for relief of cough symptoms.    Inhaled medicines. These may be prescribed to help open your airways and make it easier for you to breathe.    Steroid medicines. These may be prescribed for those with recurrent (chronic) bronchitis.  HOME CARE INSTRUCTIONS   Get plenty of rest.    Drink enough fluids to keep your urine clear or pale yellow (unless you have a medical condition that requires fluid restriction). Increasing fluids may help thin your secretions and will prevent dehydration.    Only take over-the-counter or prescription medicines as directed by your health care provider.   Only take antibiotics as directed. Make sure you finish them even if you start to feel better.   Avoid secondhand smoke, irritating chemicals, and strong fumes. These will make bronchitis worse. If you are a smoker, quit smoking. Consider using nicotine gum or  skin patches to help control withdrawal symptoms. Quitting smoking will help your lungs heal faster.    Put a cool-mist humidifier in your bedroom at night to moisten the air. This may help loosen mucus. Change the water in the humidifier daily. You can also run the hot water in your shower and sit in the bathroom with the door closed for 5-10 minutes.    Follow up with your health care provider as directed.    Wash your hands frequently to avoid catching bronchitis again or spreading an infection to others.   SEEK MEDICAL CARE IF:  Your symptoms do not improve after 1 week of treatment.   SEEK IMMEDIATE MEDICAL CARE IF:   Your fever increases.   You have chills.    You have chest pain.    You have worsening shortness of breath.    You have bloody sputum.   You faint.   You have lightheadedness.   You have a severe headache.    You vomit repeatedly.  MAKE SURE YOU:    Understand these instructions.   Will watch your condition.   Will get help right away if you are not doing well or get worse.  Document Released: 12/06/2005 Document Revised: 09/26/2013 Document Reviewed: 07/31/2013  Exit[employer] Patient Information 2015 [employer]. This information is not intended to replace advice given to you by your health care provider. Make sure you discuss any questions you have with your health care   provider.

## 2014-08-13 ENCOUNTER — Telehealth (HOSPITAL_BASED_OUTPATIENT_CLINIC_OR_DEPARTMENT_OTHER): Payer: Self-pay | Admitting: Emergency Medicine

## 2014-08-13 ENCOUNTER — Encounter (HOSPITAL_COMMUNITY): Payer: Self-pay | Admitting: Emergency Medicine

## 2014-08-13 ENCOUNTER — Emergency Department (HOSPITAL_COMMUNITY)
Admission: EM | Admit: 2014-08-13 | Discharge: 2014-08-13 | Discharge status: Against Medical Advice | Payer: Medicaid Other | Attending: Emergency Medicine | Admitting: Emergency Medicine

## 2014-08-13 DIAGNOSIS — F17200 Nicotine dependence, unspecified, uncomplicated: Secondary | ICD-10-CM | POA: Diagnosis not present

## 2014-08-13 DIAGNOSIS — Y939 Activity, unspecified: Secondary | ICD-10-CM | POA: Insufficient documentation

## 2014-08-13 DIAGNOSIS — R011 Cardiac murmur, unspecified: Secondary | ICD-10-CM | POA: Diagnosis not present

## 2014-08-13 DIAGNOSIS — Y9289 Other specified places as the place of occurrence of the external cause: Secondary | ICD-10-CM | POA: Insufficient documentation

## 2014-08-13 DIAGNOSIS — W57XXXA Bitten or stung by nonvenomous insect and other nonvenomous arthropods, initial encounter: Secondary | ICD-10-CM

## 2014-08-13 DIAGNOSIS — L03119 Cellulitis of unspecified part of limb: Principal | ICD-10-CM

## 2014-08-13 DIAGNOSIS — S90569A Insect bite (nonvenomous), unspecified ankle, initial encounter: Secondary | ICD-10-CM | POA: Insufficient documentation

## 2014-08-13 DIAGNOSIS — L02419 Cutaneous abscess of limb, unspecified: Secondary | ICD-10-CM | POA: Insufficient documentation

## 2014-08-13 DIAGNOSIS — L03115 Cellulitis of right lower limb: Secondary | ICD-10-CM

## 2014-08-13 MED ORDER — DIPHENHYDRAMINE HCL 25 MG PO CAPS
25.0000 mg | ORAL_CAPSULE | Freq: Once | ORAL | Status: AC
  Administered 2014-08-13: 25 mg via ORAL
  Filled 2014-08-13: qty 1

## 2014-08-13 NOTE — ED Notes (Signed)
Patient c/o pain inner aspect of right thigh states she thinks she was bitten by a spider. Area inner aspect of right thigh red and swollen. Positive pedal pulse

## 2014-08-13 NOTE — ED Provider Notes (Addendum)
CSN: [id number]     Arrival date & time 08/13/14  0028 History   First MD Initiated Contact with Patient 08/13/14 [phone]     Chief Complaint  Patient presents with  . Insect Bite     (Consider location/radiation/quality/duration/timing/severity/associated sxs/prior Treatment) HPI  Rachel Heath is a 23 year old female with no significant past medical history here for a spider bite. Patient stated this occurred Sunday afternoon. It started out small and has since grown in diameter. The spider bite is located in her right medial thigh. This and denies any fevers or chills. She denies any purulent drainage from the area. Patient has no other complaints.  Past Medical History  Diagnosis Date  . Ganglion cyst     right hand  . Heart murmur     per parent  . Sickle cell trait    Past Surgical History  Procedure Laterality Date  . Hand surgery     No family history on file. History  Substance Use Topics  . Smoking status: Light Tobacco Smoker  . Smokeless tobacco: Not on file  . Alcohol Use: No   OB History   Grav Para Term Preterm Abortions TAB SAB Ect Mult Living   Review of Systems 10 Systems reviewed and are negative for acute change except as noted in the HPI.    Allergies  Review of patient's allergies indicates no known allergies.  Home Medications   Prior to Admission medications   Not on File   BP 114/69  Pulse 82  Temp(Src) 98.6 F (37 C) (Oral)  Resp 17  SpO2 99% Physical Exam  ED Course  Procedures (including critical care time) Labs Review Labs Reviewed - No data to display  Imaging Review No results found.   EKG Interpretation None      MDM   Final diagnoses:  None    Patient lives in the emergency department prior to complete evaluation. During my initial examination I informed the patient that she would likely need an antibiotic to clear up the cellulitis around the spider bite. Patient demonstrated understanding of  this, however when I went to reexamine the patient had eloped. Patient will be attempted to be contacted via phone and antibiotic prescription will be sent to her pharmacy.    Tomasita Crumble, MD 08/13/14 1191    Tomasita Crumble, MD 08/13/14 [phone]

## 2014-08-13 NOTE — ED Notes (Signed)
Per Patient: Patient reports being bit by an insect last night. When she woke up this morning she had increased swelling and redness to her R thigh. Pt report 9/10 pain, increases upon ambulation. Ax4, NAD. Pt unsure what kind of insect bit her.

## 2014-08-13 NOTE — ED Notes (Signed)
Patient left stating she couldn't wait any longer. Seen leaving by Nurse Sec.

## 2014-09-18 ENCOUNTER — Encounter (HOSPITAL_COMMUNITY): Payer: Self-pay | Admitting: Emergency Medicine

## 2014-09-18 ENCOUNTER — Emergency Department (HOSPITAL_COMMUNITY)
Admission: EM | Admit: 2014-09-18 | Discharge: 2014-09-18 | Disposition: A | Discharge status: Home / Self Care | Payer: Medicaid Other | Attending: Emergency Medicine | Admitting: Emergency Medicine

## 2014-09-18 DIAGNOSIS — L509 Urticaria, unspecified: Secondary | ICD-10-CM | POA: Diagnosis present

## 2014-09-18 DIAGNOSIS — R011 Cardiac murmur, unspecified: Secondary | ICD-10-CM | POA: Insufficient documentation

## 2014-09-18 DIAGNOSIS — L500 Allergic urticaria: Secondary | ICD-10-CM | POA: Insufficient documentation

## 2014-09-18 DIAGNOSIS — Z8739 Personal history of other diseases of the musculoskeletal system and connective tissue: Secondary | ICD-10-CM | POA: Diagnosis not present

## 2014-09-18 DIAGNOSIS — Z862 Personal history of diseases of the blood and blood-forming organs and certain disorders involving the immune mechanism: Secondary | ICD-10-CM | POA: Diagnosis not present

## 2014-09-18 DIAGNOSIS — F17200 Nicotine dependence, unspecified, uncomplicated: Secondary | ICD-10-CM | POA: Insufficient documentation

## 2014-09-18 DIAGNOSIS — T7840XA Allergy, unspecified, initial encounter: Secondary | ICD-10-CM

## 2014-09-18 MED ORDER — DIPHENHYDRAMINE HCL 25 MG PO TABS: 25.0000 mg | ORAL_TABLET | Freq: Four times a day (QID) | ORAL | Status: DC | PRN

## 2014-09-18 MED ORDER — DIPHENHYDRAMINE HCL 25 MG PO CAPS
50.0000 mg | ORAL_CAPSULE | Freq: Once | ORAL | Status: AC
  Administered 2014-09-18: 50 mg via ORAL
  Filled 2014-09-18: qty 2

## 2014-09-18 MED ORDER — FAMOTIDINE 20 MG PO TABS
20.0000 mg | ORAL_TABLET | Freq: Once | ORAL | Status: AC
  Administered 2014-09-18: 20 mg via ORAL
  Filled 2014-09-18: qty 1

## 2014-09-18 MED ORDER — DEXAMETHASONE SODIUM PHOSPHATE 10 MG/ML IJ SOLN
10.0000 mg | Freq: Once | INTRAMUSCULAR | Status: AC
  Administered 2014-09-18: 10 mg via INTRAMUSCULAR
  Filled 2014-09-18: qty 1

## 2014-09-18 MED ORDER — FAMOTIDINE 40 MG PO TABS
20.0000 mg | ORAL_TABLET | Freq: Two times a day (BID) | ORAL | Status: DC
Start: 2014-09-18 — End: 2014-09-20

## 2014-09-18 NOTE — Discharge Instructions (Signed)

## 2014-09-18 NOTE — ED Provider Notes (Signed)
CSN: [id number]     Arrival date & time 09/18/14  2013 History  This chart was scribed for non-physician practitioner, Junious Silk, PA-C, working with Toy Baker, MD, by Modena Jansky, ED Scribe. This patient was seen in room WTR6/WTR6 and the patient's care was started at 8:48 PM.    Chief Complaint  Patient presents with  . Urticaria   The history is provided by the patient. No language interpreter was used.   HPI Comments: Rachel Heath is a 23 y.o. female who presents to the Emergency Department complaining of a rash that started this morning. She states that she has hives on her upper extremities and back. She reports that her hives are itchy and have been worsening throughout the day. She states that the rash is on her upper extremities and back. She reports that she used cortisone cream without any relief. She states that she had some SOB that has gone away. She denies any throat closing sensation, sick contacts, or any new products.   Past Medical History  Diagnosis Date  . Ganglion cyst     right hand  . Heart murmur     per parent  . Sickle cell trait    Past Surgical History  Procedure Laterality Date  . Hand surgery     History reviewed. No pertinent family history. History  Substance Use Topics  . Smoking status: Light Tobacco Smoker  . Smokeless tobacco: Not on file  . Alcohol Use: No   OB History   Grav Para Term Preterm Abortions TAB SAB Ect Mult Living   3 1 1  2 1 1   1      Review of Systems  Respiratory: Positive for shortness of breath.   Skin: Positive for rash.  All other systems reviewed and are negative.   Allergies  Review of patient's allergies indicates no known allergies.  Home Medications   Prior to Admission medications   Not on File   BP 117/66  Pulse 78  Temp(Src) 98.7 F (37.1 C) (Oral)  Resp 16  SpO2 100%  LMP 09/04/2014 Physical Exam  Nursing note and vitals reviewed. Constitutional: She is oriented to person,  place, and time. She appears well-developed and well-nourished. No distress.  HENT:  Head: Normocephalic and atraumatic.  Right Ear: External ear normal.  Left Ear: External ear normal.  Nose: Nose normal.  Mouth/Throat: Oropharynx is clear and moist.  Easily maintaining own secretions.   Eyes: Conjunctivae are normal.  Neck: Normal range of motion.  Cardiovascular: Normal rate, regular rhythm and normal heart sounds.   Pulmonary/Chest: Effort normal and breath sounds normal. No stridor. No respiratory distress. She has no wheezes. She has no rales.  Speaking in full sentences.  Abdominal: Soft. She exhibits no distension.  Musculoskeletal: Normal range of motion.  Neurological: She is alert and oriented to person, place, and time. She has normal strength.  Skin: Skin is warm and dry. Rash noted. She is not diaphoretic. No erythema.  Mild urticarial rash to bilateral upper extremities and back.  Psychiatric: She has a normal mood and affect. Her behavior is normal.    ED Course  Procedures (including critical care time) DIAGNOSTIC STUDIES: Oxygen Saturation is 100% on RA, normal by my interpretation.    COORDINATION OF CARE: 8:52 PM- Pt advised of plan for treatment which includes medication and pt agrees.  Labs Review Labs Reviewed - No data to display  Imaging Review No results found.   EKG Interpretation  None      MDM   Final diagnoses:  Allergic reaction, initial encounter   Patient evaluated prior to dc, is hemodynamically stable, in no respiratory distress, and denies the feeling of throat closing. Pt has been advised to take OTC benadryl & return to the ED if they have a mod-severe allergic rxn (s/s including throat closing, difficulty breathing, swelling of lips face or tongue). Pt is to follow up with their PCP. Pt is agreeable with plan & verbalizes understanding.  Medications  dexamethasone (DECADRON) injection 10 mg (10 mg Intramuscular Given 09/18/14 2121)   diphenhydrAMINE (BENADRYL) capsule 50 mg (50 mg Oral Given 09/18/14 2121)  famotidine (PEPCID) tablet 20 mg (20 mg Oral Given 09/18/14 2121)     I personally performed the services described in this documentation, which was scribed in my presence. The recorded information has been reviewed and is accurate.     Mora BellmanHannah S Tajha Sammarco, PA-C 09/18/14 2146

## 2014-09-18 NOTE — ED Notes (Signed)
Pt complains of breaking out in hives earlier today, no change in detergents or lotions

## 2014-09-18 NOTE — ED Notes (Signed)
Bed: WTR6 Expected date:  Expected time:  Means of arrival:  Comments: Drying

## 2014-09-18 NOTE — ED Provider Notes (Signed)
Medical screening examination/treatment/procedure(s) were performed by non-physician practitioner and as supervising physician I was immediately available for consultation/collaboration.  Jayshon Dommer T Chereese Cilento, MD 09/18/14 2315 

## 2014-09-20 ENCOUNTER — Encounter (HOSPITAL_COMMUNITY): Payer: Self-pay | Admitting: Emergency Medicine

## 2014-09-20 ENCOUNTER — Emergency Department (HOSPITAL_COMMUNITY)
Admission: EM | Admit: 2014-09-20 | Discharge: 2014-09-20 | Disposition: A | Discharge status: Home / Self Care | Payer: Medicaid Other | Attending: Emergency Medicine | Admitting: Emergency Medicine

## 2014-09-20 DIAGNOSIS — Z8739 Personal history of other diseases of the musculoskeletal system and connective tissue: Secondary | ICD-10-CM | POA: Insufficient documentation

## 2014-09-20 DIAGNOSIS — L299 Pruritus, unspecified: Secondary | ICD-10-CM

## 2014-09-20 DIAGNOSIS — R011 Cardiac murmur, unspecified: Secondary | ICD-10-CM | POA: Diagnosis not present

## 2014-09-20 DIAGNOSIS — Z720 Tobacco use: Secondary | ICD-10-CM | POA: Diagnosis not present

## 2014-09-20 DIAGNOSIS — Z862 Personal history of diseases of the blood and blood-forming organs and certain disorders involving the immune mechanism: Secondary | ICD-10-CM | POA: Diagnosis not present

## 2014-09-20 DIAGNOSIS — R21 Rash and other nonspecific skin eruption: Secondary | ICD-10-CM | POA: Diagnosis present

## 2014-09-20 MED ORDER — HYDROXYZINE HCL 25 MG PO TABS
25.0000 mg | ORAL_TABLET | Freq: Once | ORAL | Status: AC
  Administered 2014-09-20: 25 mg via ORAL
  Filled 2014-09-20: qty 1

## 2014-09-20 MED ORDER — HYDROXYZINE HCL 25 MG PO TABS: 25.0000 mg | ORAL_TABLET | Freq: Four times a day (QID) | ORAL | Status: DC | PRN

## 2014-09-20 NOTE — ED Notes (Signed)
Patient reports she was seen here yesterday for same complaint.  She states that she has not been exposed to fleas or bedbugs that she is aware of.

## 2014-09-20 NOTE — ED Notes (Signed)
Pt states that she began having hives / rash on Wed; pt states that she was seen on Wed and given steriods, Bendaryl and Pepcid; pt states that the rash has continued and she is still itching; pt denies difficulty breathing

## 2014-09-20 NOTE — Discharge Instructions (Signed)
Pruritus  °Pruritus is an itch. There are many different problems that can cause an itch. Dry skin is one of the most common causes of itching. Most cases of itching do not require medical attention.  °HOME CARE INSTRUCTIONS  °Make sure your skin is moistened on a regular basis. A moisturizer that contains petroleum jelly is best for keeping moisture in your skin. If you develop a rash, you may try the following for relief:  °· Use corticosteroid cream. °· Apply cool compresses to the affected areas. °· Bathe with Epsom salts or baking soda in the bathwater. °· Soak in colloidal oatmeal baths. These are available at your pharmacy. °· Apply baking soda paste to the rash. Stir water into baking soda until it reaches a paste-like consistency. °· Use an anti-itch lotion. °· Take over-the-counter diphenhydramine medicine by mouth as the instructions direct. °· Avoid scratching. Scratching may cause the rash to become infected. If itching is very bad, your caregiver may suggest prescription lotions or creams to lessen your symptoms. °· Avoid hot showers, which can make itching worse. A cold shower may help with itching as long as you use a moisturizer after the shower. °SEEK MEDICAL CARE IF: °The itching does not go away after several days. °Document Released: 08/18/2011 Document Revised: 04/22/2014 Document Reviewed: 08/18/2011 °Exit[employer]® Patient Information ©2015 [employer]. This information is not intended to replace advice given to you by your health care provider. Make sure you discuss any questions you have with your health care provider. ° °

## 2014-09-20 NOTE — ED Provider Notes (Signed)
CSN: [id number]     Arrival date & time 09/20/14  0325 History   First MD Initiated Contact with Patient 09/20/14 0340     Chief Complaint  Patient presents with  . Rash     (Consider location/radiation/quality/duration/timing/severity/associated sxs/prior Treatment) The history is provided by the patient.   patient reports she's had ongoing itching of her entire body over the past several days.  She states it is worse when she is at home.  She was seen several days ago and was told that she had hives and was prescribed Benadryl and Pepcid.  She was given a dose of Decadron in the emergency department.  She states it helped initially now she is itching more again.  She states since leaving her home to come to the ER tonight she is feeling better now she is in the ER.  Past Medical History  Diagnosis Date  . Ganglion cyst     right hand  . Heart murmur     per parent  . Sickle cell trait    Past Surgical History  Procedure Laterality Date  . Hand surgery     No family history on file. History  Substance Use Topics  . Smoking status: Light Tobacco Smoker  . Smokeless tobacco: Not on file  . Alcohol Use: No   OB History   Grav Para Term Preterm Abortions TAB SAB Ect Mult Living   3 1 1  2 1 1   1      Review of Systems  Skin: Positive for rash.  All other systems reviewed and are negative.     Allergies  Review of patient's allergies indicates no known allergies.  Home Medications   Prior to Admission medications   Medication Sig Start Date End Date Taking? Authorizing Provider  diphenhydrAMINE (BENADRYL) 25 MG tablet Take 1 tablet (25 mg total) by mouth every 6 (six) hours as needed for itching (Rash). 09/18/14  Yes Mora BellmanHannah S Merrell, PA-C  hydrocortisone cream 1 % Apply 1 application topically 3 (three) times daily as needed for itching.   Yes Historical Provider, MD  hydrOXYzine (ATARAX/VISTARIL) 25 MG tablet Take 1 tablet (25 mg total) by mouth every 6 (six) hours  as needed for itching. 09/20/14   Lyanne CoKevin M Rashae Rother, MD   BP 137/75  Pulse 76  Temp(Src) 98.2 F (36.8 C) (Oral)  Resp 20  SpO2 99%  LMP 09/04/2014 Physical Exam  Nursing note and vitals reviewed. Constitutional: She is oriented to person, place, and time. She appears well-developed and well-nourished.  HENT:  Head: Normocephalic.  Eyes: EOM are normal.  Neck: Normal range of motion.  Pulmonary/Chest: Effort normal.  Abdominal: She exhibits no distension.  Musculoskeletal: Normal range of motion.  Neurological: She is alert and oriented to person, place, and time.  Skin: Skin is warm and dry. No rash noted.  Signs of excoriations on the arms  Psychiatric: She has a normal mood and affect.    ED Course  Procedures (including critical care time) Labs Review Labs Reviewed - No data to display  Imaging Review No results found.   EKG Interpretation None      MDM   Final diagnoses:  Pruritic condition    Possible contact dermatitis.  Diffuse itching.  Overall well-appearing.  No hives.  No anaphylaxis.  We'll add Vistaril    Lyanne CoKevin M Teagen Mcleary, MD 09/20/14 [phone]

## 2014-10-21 ENCOUNTER — Encounter (HOSPITAL_COMMUNITY): Payer: Self-pay | Admitting: Emergency Medicine

## 2014-11-23 IMAGING — CR DG CHEST 2V
2 series · 2 of 2 positions shown · non-contrast
Comparison: None.

CLINICAL DATA: Respiratory distress.

EXAM:
CHEST  2 VIEW

[w chest pa]
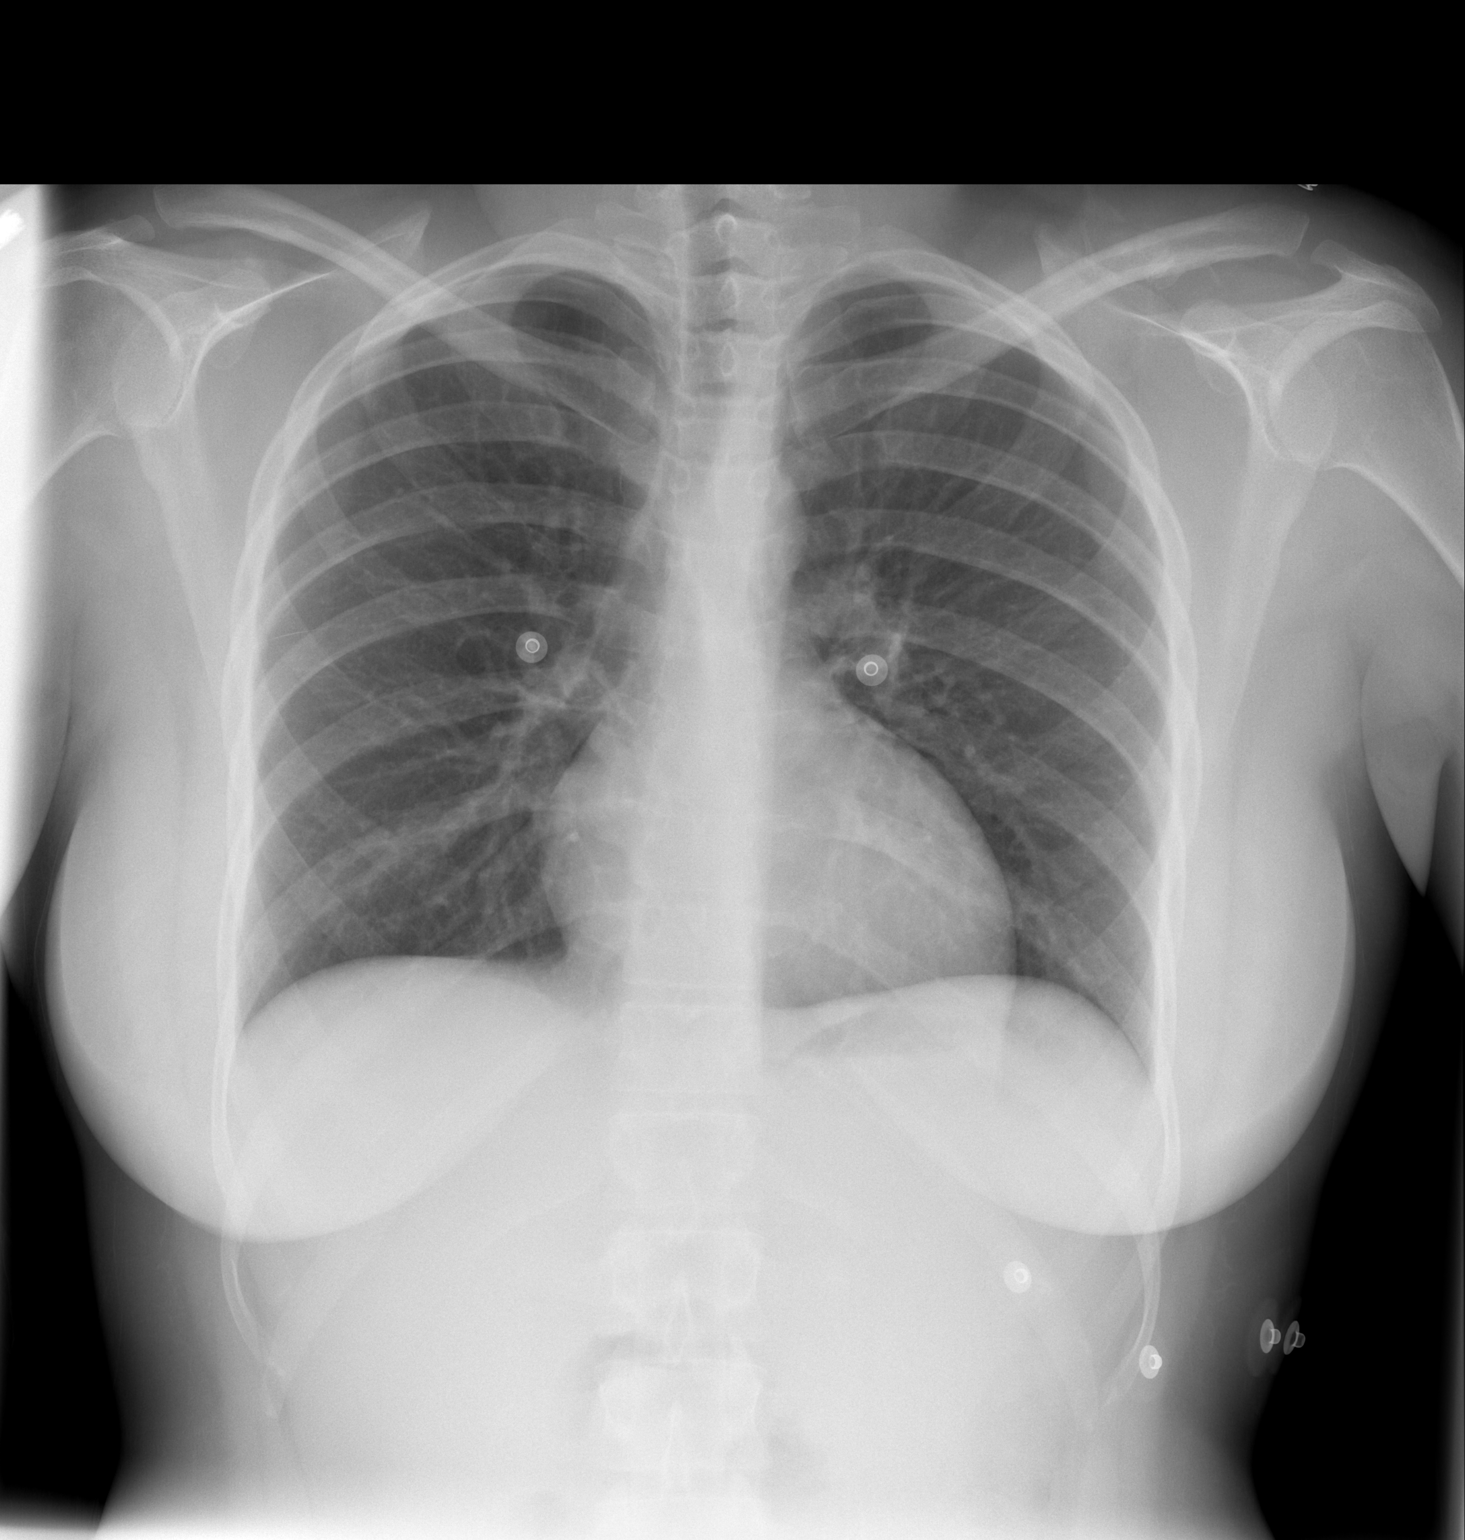

[w chest lat]
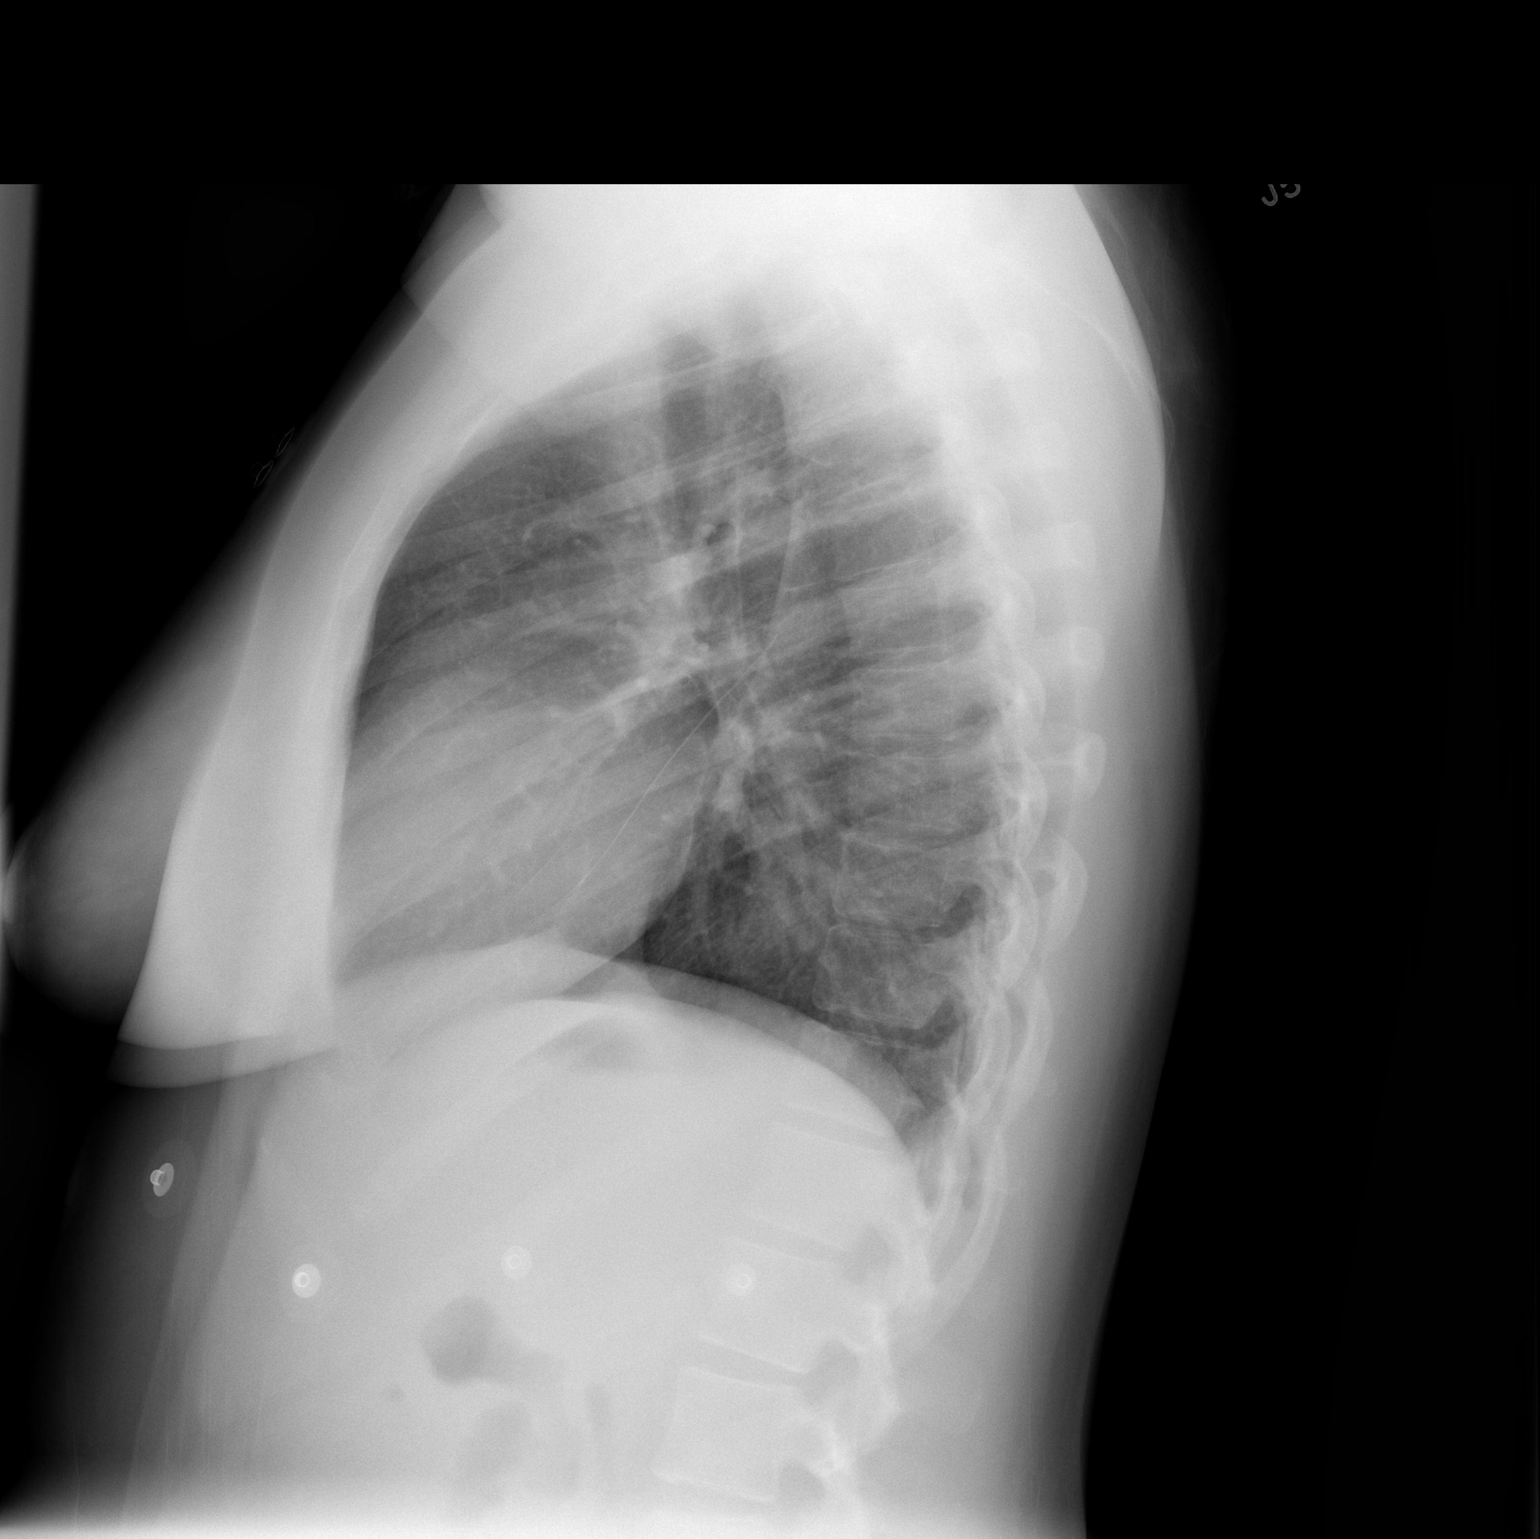

[2 of 2 positions shown; findings below may reference images not displayed]

FINDINGS: Mediastinum and hilar structures normal. The lungs are clear. Heart
size normal. No pleural effusion or pneumothorax. No acute bony
abnormality.
IMPRESSION: No active cardiopulmonary disease.

## 2015-08-07 ENCOUNTER — Emergency Department (HOSPITAL_COMMUNITY)
Admission: EM | Admit: 2015-08-07 | Discharge: 2015-08-07 | Discharge status: Absent without leave | Payer: Medicaid Other | Source: Home / Self Care

## 2015-08-08 ENCOUNTER — Encounter (HOSPITAL_COMMUNITY): Payer: Self-pay | Admitting: Emergency Medicine

## 2015-08-08 ENCOUNTER — Emergency Department (INDEPENDENT_AMBULATORY_CARE_PROVIDER_SITE_OTHER)
Admission: EM | Admit: 2015-08-08 | Discharge: 2015-08-08 | Disposition: A | Discharge status: Home / Self Care | Payer: Medicaid Other | Source: Home / Self Care | Attending: Family Medicine | Admitting: Family Medicine

## 2015-08-08 DIAGNOSIS — L600 Ingrowing nail: Secondary | ICD-10-CM

## 2015-08-08 MED ORDER — CEPHALEXIN 500 MG PO CAPS: 500.0000 mg | ORAL_CAPSULE | Freq: Four times a day (QID) | ORAL | Status: DC

## 2015-08-08 MED ORDER — DICLOFENAC POTASSIUM 50 MG PO TABS: ORAL_TABLET | ORAL | Status: DC

## 2015-08-08 NOTE — ED Provider Notes (Signed)
CSN: [id number]     Arrival date & time 08/08/15  1945 History   First MD Initiated Contact with Patient 08/08/15 2029     Chief Complaint  Patient presents with  . Ingrown Toenail   (Consider location/radiation/quality/duration/timing/severity/associated sxs/prior Treatment) HPI Comments: 24 year old female complaining of an ingrown toenail of the left big toe for over a year. She states it hurts. She request a referral to a podiatrist.   Past Medical History  Diagnosis Date  . Ganglion cyst     right hand  . Heart murmur     per parent  . Sickle cell trait    Past Surgical History  Procedure Laterality Date  . Hand surgery     No family history on file. Social History  Substance Use Topics  . Smoking status: Light Tobacco Smoker  . Smokeless tobacco: None  . Alcohol Use: No   OB History    Gravida Para Term Preterm AB TAB SAB Ectopic Multiple Living   Review of Systems  Constitutional: Negative.   Neurological: Negative.   All other systems reviewed and are negative.   Allergies  Review of patient's allergies indicates no known allergies.  Home Medications   Prior to Admission medications   Medication Sig Start Date End Date Taking? Authorizing Provider  cephALEXin (KEFLEX) 500 MG capsule Take 1 capsule (500 mg total) by mouth 4 (four) times daily. 08/08/15   Hayden Rasmussen, NP  diclofenac (CATAFLAM) 50 MG tablet One tablet TID with food prn pain. Do not breast feed while taking this medication. 08/08/15   Hayden Rasmussen, NP  diphenhydrAMINE (BENADRYL) 25 MG tablet Take 1 tablet (25 mg total) by mouth every 6 (six) hours as needed for itching (Rash). 09/18/14   Junious Silk, PA-C  hydrocortisone cream 1 % Apply 1 application topically 3 (three) times daily as needed for itching.    Historical Provider, MD  hydrOXYzine (ATARAX/VISTARIL) 25 MG tablet Take 1 tablet (25 mg total) by mouth every 6 (six) hours as needed for itching. 09/20/14   Azalia Bilis,  MD   BP 121/82 mmHg  Pulse 89  Temp(Src) 99 F (37.2 C) (Oral)  Resp 16  SpO2 98%  LMP 07/26/2015  Breastfeeding? No Physical Exam  Constitutional: She appears well-developed and well-nourished. No distress.  Cardiovascular: Normal rate.   Pulmonary/Chest: Effort normal. No respiratory distress.  Musculoskeletal: She exhibits no edema.  Left great toe with mild swelling to the medial aspect of the nail and dark pigmentation. There is a draining clear to slightly clear/brown watery drainage. No erythema. No paronychia. Positive for tenderness. No joint tenderness or redness.  Neurological: She is alert. She exhibits normal muscle tone.  Skin: Skin is warm and dry.  Nursing note and vitals reviewed.   ED Course  Procedures (including critical care time) Labs Review Labs Reviewed - No data to display  Imaging Review No results found.   MDM   1. Ingrown left big toenail    Warm salt water soaks Keflex as dir cataflam for pain F/u with podiatrist    Hayden Rasmussen, NP 08/08/15 2041

## 2015-08-08 NOTE — ED Notes (Signed)
C/o left ingrown toe nail onset 1 year ++ Sx include pain and swelling around nail bed Alert... Steady gait... No acute distress.

## 2015-08-08 NOTE — Discharge Instructions (Signed)
Infected Ingrown Toenail °An infected ingrown toenail occurs when the nail edge grows into the skin and bacteria invade the area. Symptoms include pain, tenderness, swelling, and pus drainage from the edge of the nail. Poorly fitting shoes, minor injuries, and improper cutting of the toenail may also contribute to the problem. You should cut your toenails squarely instead of rounding the edges. Do not cut them too short. Avoid tight or pointed toe shoes. Sometimes the ingrown portion of the nail must be removed. If your toenail is removed, it can take 3-4 months for it to re-grow. °HOME CARE INSTRUCTIONS  °· Soak your infected toe in warm water for 20-30 minutes, 2 to 3 times a day. °· Packing or dressings applied to the area should be changed daily. °· Take medicine as directed and finish them. °· Reduce activities and keep your foot elevated when able to reduce swelling and discomfort. Do this until the infection gets better. °· Wear sandals or go barefoot as much as possible while the infected area is sensitive. °· See your caregiver for follow-up care in 2-3 days if the infection is not better. °SEEK MEDICAL CARE IF:  °Your toe is becoming more red, swollen or painful. °MAKE SURE YOU:  °· Understand these instructions. °· Will watch your condition. °· Will get help right away if you are not doing well or get worse. °Document Released: 01/13/2005 Document Revised: 02/28/2012 Document Reviewed: 12/02/2008 °Exit[employer]® Patient Information ©2015 [employer]. This information is not intended to replace advice given to you by your health care provider. Make sure you discuss any questions you have with your health care provider. ° °

## 2015-12-11 ENCOUNTER — Ambulatory Visit (INDEPENDENT_AMBULATORY_CARE_PROVIDER_SITE_OTHER): Payer: Medicaid Other | Admitting: Podiatry

## 2015-12-11 ENCOUNTER — Encounter: Payer: Self-pay | Admitting: Podiatry

## 2015-12-11 VITALS — BP 120/69 | HR 88 | Resp 16

## 2015-12-11 DIAGNOSIS — L600 Ingrowing nail: Secondary | ICD-10-CM

## 2015-12-11 MED ORDER — NEOMYCIN-POLYMYXIN-HC 1 % OT SOLN: OTIC | Status: DC

## 2015-12-11 NOTE — Patient Instructions (Signed)

## 2015-12-15 NOTE — Progress Notes (Signed)
She presents today with a chief complaint of painful ingrown toenail to the fibular border hallux left. She states that she would like to have this fixed this been going away to long now. She's tried at home remedies nothing has seemed to help.  Objective: Vital signs are stable she is alert and oriented 3 pulses are intact. No open lesions or wounds. She does have erythema and edema along the fibular border of the hallux left. This is consistent with a sharply incurvated nail margins and a paronychia.  Assessment: Paronychia fibular border hallux left.  Plan: Discussed etiology pathology conservative versus surgical therapies. At this point we performed a chemical matrixectomy fibular border after local anesthesia was administered she tolerated this procedure well. Started her on a Cortisporin otic drops after soaking twice daily. She was given both oral and written home-going instructions for the care and soaking of her toe. I will follow-up with her in 1 week.

## 2015-12-18 ENCOUNTER — Ambulatory Visit: Payer: Medicaid Other | Admitting: Podiatry

## 2015-12-25 ENCOUNTER — Encounter: Payer: Self-pay | Admitting: Podiatry

## 2015-12-25 ENCOUNTER — Ambulatory Visit (INDEPENDENT_AMBULATORY_CARE_PROVIDER_SITE_OTHER): Payer: Medicaid Other | Admitting: Podiatry

## 2015-12-25 DIAGNOSIS — L600 Ingrowing nail: Secondary | ICD-10-CM

## 2015-12-25 NOTE — Progress Notes (Signed)
She presents today 1 week status post matrixectomy hallux left. She's been soaking in Betadine and water and apply Cortisporin Otic and directed. She states that is doing very well.  Objective: Vital signs stable alert and oriented 3. Pulses are strongly palpable. No erythema or edema cellulitis drainage or odor nails appear to be healing well.  Assessment: Well-healing surgical matrixectomy.  Plan: Discontinue Betadine*with Epsom salts and water soaks, during the nailing of .

## 2015-12-25 NOTE — Patient Instructions (Signed)

## 2016-09-06 ENCOUNTER — Encounter (HOSPITAL_COMMUNITY): Payer: Self-pay | Admitting: Emergency Medicine

## 2016-09-06 DIAGNOSIS — O208 Other hemorrhage in early pregnancy: Secondary | ICD-10-CM | POA: Diagnosis not present

## 2016-09-06 DIAGNOSIS — Z79899 Other long term (current) drug therapy: Secondary | ICD-10-CM | POA: Diagnosis not present

## 2016-09-06 DIAGNOSIS — G44209 Tension-type headache, unspecified, not intractable: Secondary | ICD-10-CM | POA: Diagnosis not present

## 2016-09-06 DIAGNOSIS — O99331 Smoking (tobacco) complicating pregnancy, first trimester: Secondary | ICD-10-CM | POA: Insufficient documentation

## 2016-09-06 DIAGNOSIS — O26891 Other specified pregnancy related conditions, first trimester: Secondary | ICD-10-CM | POA: Insufficient documentation

## 2016-09-06 DIAGNOSIS — Z3A11 11 weeks gestation of pregnancy: Secondary | ICD-10-CM | POA: Insufficient documentation

## 2016-09-06 DIAGNOSIS — F17200 Nicotine dependence, unspecified, uncomplicated: Secondary | ICD-10-CM | POA: Insufficient documentation

## 2016-09-06 NOTE — ED Triage Notes (Signed)
Pt is currently is 11 weeks and 5 day pregnant. Pt states she woke up at 3 pm today with lower right sided abdominal pain, that isn't as severe anymore. C/o headache and being "really cold."

## 2016-09-07 ENCOUNTER — Emergency Department (HOSPITAL_COMMUNITY)
Admission: EM | Admit: 2016-09-07 | Discharge: 2016-09-07 | Disposition: A | Discharge status: Home / Self Care | Payer: Medicaid Other | Attending: Emergency Medicine | Admitting: Emergency Medicine

## 2016-09-07 ENCOUNTER — Emergency Department (HOSPITAL_COMMUNITY): Payer: Medicaid Other

## 2016-09-07 DIAGNOSIS — G44209 Tension-type headache, unspecified, not intractable: Secondary | ICD-10-CM

## 2016-09-07 DIAGNOSIS — O418X10 Other specified disorders of amniotic fluid and membranes, first trimester, not applicable or unspecified: Secondary | ICD-10-CM

## 2016-09-07 DIAGNOSIS — R102 Pelvic and perineal pain: Secondary | ICD-10-CM

## 2016-09-07 DIAGNOSIS — O468X1 Other antepartum hemorrhage, first trimester: Secondary | ICD-10-CM

## 2016-09-07 LAB — I-STAT BETA HCG BLOOD, ED (MC, WL, AP ONLY)

## 2016-09-07 LAB — WET PREP, GENITAL
Clue Cells Wet Prep HPF POC: NONE SEEN
SPERM: NONE SEEN
TRICH WET PREP: NONE SEEN
Yeast Wet Prep HPF POC: NONE SEEN

## 2016-09-07 LAB — GC/CHLAMYDIA PROBE AMP (~~LOC~~) NOT AT ARMC
Chlamydia: NEGATIVE
NEISSERIA GONORRHEA: NEGATIVE

## 2016-09-07 MED ORDER — ACETAMINOPHEN 325 MG PO TABS
650.0000 mg | ORAL_TABLET | Freq: Once | ORAL | Status: AC
  Administered 2016-09-07: 650 mg via ORAL
  Filled 2016-09-07: qty 2

## 2016-09-07 NOTE — ED Provider Notes (Signed)
[facility]-EMERGENCY DEPT Provider Note   CSN: [id number] Arrival date & time: 09/06/16  2220     History   Chief Complaint Chief Complaint  Patient presents with  . Headache    HPI Rachel Heath is a 25 y.o. female.  HPI  Patient to the ER to be evaluated for lower right abd pain that is now mild. She reports waking up around 3 pm with significant pain. She is also complaining of having headache and feeling cold.  The headache is moderate, frontal throbbing pain, she has not tried taking anything for it. She is 11 weeks and 5 days pregnant, she has been taking prenatal vitamins. She has not had an [place] confirming IUP. Her pain was intermittent sharp shooting pains that only lasted a few seconds at a time. She has not had any vaginal bleeding, vaginal discharge, fevers, neck pain, SOB, CP, N/V/D, weakness, confusion, back pain  Past Medical History:  Diagnosis Date  . Ganglion cyst    right hand  . Heart murmur    per parent  . Sickle cell trait [facility])     Patient Active Problem List   Diagnosis Date Noted  . NSVD (normal spontaneous vaginal delivery) 04/19/2014    Past Surgical History:  Procedure Laterality Date  . HAND SURGERY      OB History    Gravida Para Term Preterm AB Living   4 1 1   2 1    SAB TAB Ectopic Multiple Live Births   1 1     1        Home Medications    Prior to Admission medications   Medication Sig Start Date End Date Taking? Authorizing Provider  Prenatal Vit-Fe Fumarate-FA (PRENATAL MULTIVITAMIN) TABS tablet Take 1 tablet by mouth daily.   Yes Historical Provider, MD    Family History No family history on file.  Social History Social History  Substance Use Topics  . Smoking status: Light Tobacco Smoker  . Smokeless tobacco: Not on file  . Alcohol use No     Allergies   Review of patient's allergies indicates no known allergies.   Review of Systems Review of Systems Review of Systems All other systems negative except as  documented in the HPI. All pertinent positives and negatives as reviewed in the HPI.   Physical Exam Updated Vital Signs BP 111/72 (BP Location: Left Arm)   Pulse 80   Temp 99.1 F (37.3 C) (Oral)   Resp 18   Ht 5\' 4"  (1.626 m)   Wt 81.6 kg   SpO2 99%   BMI 30.90 kg/m   Physical Exam  Constitutional: She appears well-developed and well-nourished.  HENT:  Head: Normocephalic and atraumatic.  Eyes: Conjunctivae are normal. Pupils are equal, round, and reactive to light.  Neck: Trachea normal, normal range of motion and full passive range of motion without pain. Neck supple.  Cardiovascular: Normal rate, regular rhythm and normal pulses.   Pulmonary/Chest: Effort normal and breath sounds normal. Chest wall is not dull to percussion. She exhibits no tenderness, no crepitus, no edema, no deformity and no retraction.  Abdominal: Soft. Normal appearance and bowel sounds are normal.  Genitourinary: Vagina normal. There is no rash or tenderness on the right labia. There is no rash or tenderness on the left labia. Uterus is enlarged. Cervix exhibits no motion tenderness, no discharge and no friability. Right adnexum displays no tenderness. Left adnexum displays no tenderness.  Genitourinary Comments: Clear mucous discharge  Musculoskeletal: Normal range  of motion.  Neurological: She is alert. She has normal strength.  Skin: Skin is warm, dry and intact.  Psychiatric: She has a normal mood and affect. Her speech is normal and behavior is normal. Judgment and thought content normal. Cognition and memory are normal.     ED Treatments / Results  Labs (all labs ordered are listed, but only abnormal results are displayed) Labs Reviewed  WET PREP, GENITAL - Abnormal; Notable for the following:       Result Value   WBC, Wet Prep HPF POC FEW (*)    All other components within normal limits  I-STAT BETA HCG BLOOD, ED ([facility], [facility], [facility] ONLY) - Abnormal; Notable for the following:    I-stat hCG,  quantitative >2,000.0 (*)    All other components within normal limits  GC/CHLAMYDIA PROBE AMP ([place]) NOT AT [facility]    EKG  EKG Interpretation None       Radiology US Ob Comp Less 14 Wks  Result Date: 09/07/2016 CLINICAL DATA:  To confirm IUP. Estimated gestational age by LMP is 11 weeks 6 days. Pelvic pain since 3 p.m. Quantitative beta HCG is in progress. EXAM: OBSTETRIC <14 WK [place] AND TRANSVAGINAL OB US TECHNIQUE: Both transabdominal and transvaginal ultrasound examinations were performed for complete evaluation of the gestation as well as the maternal uterus, adnexal regions, and pelvic cul-de-sac. Transvaginal technique was performed to assess early pregnancy. COMPARISON:  None. FINDINGS: Intrauterine gestational sac: Ache seen pregnancy is identified Yolk sac:  Yolk sac is not present consistent with gestational age. Embryo:  Fetal pole is identified. Cardiac Activity: Fetal cardiac activity is identified. Heart Rate: 163  bpm CRL:  51  mm   11 w   5 d                  [place] EDC: 03/24/2017 Subchorionic hemorrhage: Small subchorionic hemorrhage is demonstrated in the lower uterine segment. This is over the area of the internal cervical os. Maternal uterus/adnexae: No myometrial mass lesions identified. Both ovaries are visualized and appear normal. No free fluid in the pelvis. IMPRESSION: Single intrauterine pregnancy. Estimated gestational age by crown-rump length is 11 weeks 5 days. Small subchorionic hemorrhage demonstrated in the lower uterine segment. Electronically Signed   By: Burman Nieves M.D.   On: 09/07/2016 03:37   US Ob Transvaginal  Result Date: 09/07/2016 CLINICAL DATA:  To confirm IUP. Estimated gestational age by LMP is 11 weeks 6 days. Pelvic pain since 3 p.m. Quantitative beta HCG is in progress. EXAM: OBSTETRIC <14 WK [place] AND TRANSVAGINAL OB US TECHNIQUE: Both transabdominal and transvaginal ultrasound examinations were performed for complete evaluation of the  gestation as well as the maternal uterus, adnexal regions, and pelvic cul-de-sac. Transvaginal technique was performed to assess early pregnancy. COMPARISON:  None. FINDINGS: Intrauterine gestational sac: Ache seen pregnancy is identified Yolk sac:  Yolk sac is not present consistent with gestational age. Embryo:  Fetal pole is identified. Cardiac Activity: Fetal cardiac activity is identified. Heart Rate: 163  bpm CRL:  51  mm   11 w   5 d                  [place] EDC: 03/24/2017 Subchorionic hemorrhage: Small subchorionic hemorrhage is demonstrated in the lower uterine segment. This is over the area of the internal cervical os. Maternal uterus/adnexae: No myometrial mass lesions identified. Both ovaries are visualized and appear normal. No free fluid in the pelvis. IMPRESSION: Single intrauterine pregnancy. Estimated  gestational age by crown-rump length is 11 weeks 5 days. Small subchorionic hemorrhage demonstrated in the lower uterine segment. Electronically Signed   By: Burman NievesWilliam  Stevens M.D.   On: 09/07/2016 03:37    Procedures Procedures (including critical care time)  Medications Ordered in ED Medications  acetaminophen (TYLENOL) tablet 650 mg (650 mg Oral Given 09/07/16 0327)     Initial Impression / Assessment and Plan / ED Course  I have reviewed the triage vital signs and the nursing notes.  Pertinent labs & imaging results that were available during my care of the patient were reviewed by me and considered in my medical decision making (see chart for details).  Clinical Course    Patient has confirmed IUP on US, with subchorionic hemorrhage, will need to follow up with OB regarding this finding. Wet prep negative, GC sent out for culture Headache mostly resolved with Tylenol  Patient is on prenatals, not having any pain in the abdomen currently. I discussed results, diagnoses and plan with Jonette Pesayeasha M Luka. They voice there understanding and questions were answered. We discussed  follow-up recommendations and return precautions.   Final Clinical Impressions(s) / ED Diagnoses   Final diagnoses:  Subchorionic hemorrhage, first trimester  Tension-type headache, not intractable, unspecified chronicity pattern    New Prescriptions New Prescriptions   No medications on file     Marlon Peliffany Rosana Farnell, PA-C 09/07/16 0423    Geoffery Lyonsouglas Delo, MD 09/07/16 [phone]

## 2016-09-20 ENCOUNTER — Encounter (HOSPITAL_COMMUNITY): Payer: Self-pay

## 2016-09-20 ENCOUNTER — Inpatient Hospital Stay (HOSPITAL_COMMUNITY)
Admission: AD | Admit: 2016-09-20 | Discharge: 2016-09-21 | Disposition: A | Discharge status: Home / Self Care | Payer: Medicaid Other | Source: Ambulatory Visit | Attending: Obstetrics and Gynecology | Admitting: Obstetrics and Gynecology

## 2016-09-20 DIAGNOSIS — F17200 Nicotine dependence, unspecified, uncomplicated: Secondary | ICD-10-CM | POA: Diagnosis not present

## 2016-09-20 DIAGNOSIS — B373 Candidiasis of vulva and vagina: Secondary | ICD-10-CM

## 2016-09-20 DIAGNOSIS — Z3A13 13 weeks gestation of pregnancy: Secondary | ICD-10-CM | POA: Insufficient documentation

## 2016-09-20 DIAGNOSIS — O98811 Other maternal infectious and parasitic diseases complicating pregnancy, first trimester: Secondary | ICD-10-CM | POA: Diagnosis not present

## 2016-09-20 DIAGNOSIS — B3731 Acute candidiasis of vulva and vagina: Secondary | ICD-10-CM

## 2016-09-20 DIAGNOSIS — O99331 Smoking (tobacco) complicating pregnancy, first trimester: Secondary | ICD-10-CM | POA: Insufficient documentation

## 2016-09-20 DIAGNOSIS — N898 Other specified noninflammatory disorders of vagina: Secondary | ICD-10-CM | POA: Diagnosis present

## 2016-09-20 NOTE — MAU Note (Signed)
Urine in lab 

## 2016-09-20 NOTE — MAU Note (Signed)
Pt reports yeast infection since Saturday. Used Monistat-helped some, but had brown discharge and the color change concerned her. Denies pain.

## 2016-09-21 DIAGNOSIS — B373 Candidiasis of vulva and vagina: Secondary | ICD-10-CM | POA: Diagnosis not present

## 2016-09-21 DIAGNOSIS — O98811 Other maternal infectious and parasitic diseases complicating pregnancy, first trimester: Secondary | ICD-10-CM | POA: Diagnosis not present

## 2016-09-21 DIAGNOSIS — Z3A13 13 weeks gestation of pregnancy: Secondary | ICD-10-CM

## 2016-09-21 LAB — GC/CHLAMYDIA PROBE AMP (~~LOC~~) NOT AT ARMC
CHLAMYDIA, DNA PROBE: NEGATIVE
Neisseria Gonorrhea: NEGATIVE

## 2016-09-21 LAB — WET PREP, GENITAL
CLUE CELLS WET PREP: NONE SEEN
Sperm: NONE SEEN
TRICH WET PREP: NONE SEEN

## 2016-09-21 MED ORDER — TERCONAZOLE 0.4 % VA CREA: 1.0000 | TOPICAL_CREAM | Freq: Every day | VAGINAL | 0 refills | Status: DC

## 2016-09-21 NOTE — Discharge Instructions (Signed)

## 2016-09-21 NOTE — MAU Provider Note (Signed)
  History     CSN: [id number]  Arrival date and time: 09/20/16 2234   First Provider Initiated Contact with Patient 09/21/16 0044      Chief Complaint  Patient presents with  . Vaginal Discharge   Vaginal Discharge  The patient's primary symptoms include genital itching and vaginal discharge. This is a new problem. The current episode started in the past 7 days. The problem occurs constantly. The problem has been unchanged. The patient is experiencing no pain. She is pregnant. Pertinent negatives include no abdominal pain, chills, constipation, diarrhea, dysuria, fever, frequency, nausea, urgency or vomiting. The vaginal discharge was thick, white and brown. There has been no bleeding. Nothing aggravates the symptoms. She has tried antifungals (one day monistat) for the symptoms. The treatment provided no relief.   Past Medical History:  Diagnosis Date  . Ganglion cyst    right hand  . Heart murmur    per parent  . Sickle cell trait [facility](H[facility])     Past Surgical History:  Procedure Laterality Date  . HAND SURGERY      History reviewed. No pertinent family history.  Social History  Substance Use Topics  . Smoking status: Light Tobacco Smoker  . Smokeless tobacco: Not on file  . Alcohol use No    Allergies: No Known Allergies  Prescriptions Prior to Admission  Medication Sig Dispense Refill Last Dose  . Prenatal Vit-Fe Fumarate-FA (PRENATAL MULTIVITAMIN) TABS tablet Take 1 tablet by mouth daily.   09/20/2016 at Unknown time    Review of Systems  Constitutional: Negative for chills and fever.  Gastrointestinal: Negative for abdominal pain, constipation, diarrhea, nausea and vomiting.  Genitourinary: Positive for vaginal discharge. Negative for dysuria, frequency and urgency.   Physical Exam   Blood pressure 104/60, pulse 85, temperature 98.4 F (36.9 C), temperature source Oral, resp. rate 16, SpO2 100 %.  Physical Exam  Nursing note and vitals reviewed. Constitutional:  She is oriented to person, place, and time. She appears well-developed and well-nourished. No distress.  HENT:  Head: Normocephalic.  Cardiovascular: Normal rate.   Respiratory: Effort normal.  GI: Soft. There is no tenderness. There is no rebound.  Genitourinary:  Genitourinary Comments: FHT: 151 with doppler   Neurological: She is alert and oriented to person, place, and time.  Skin: Skin is warm and dry.  Psychiatric: She has a normal mood and affect.   Results for orders placed or performed during the hospital encounter of 09/20/16 (from the past 24 hour(s))  Wet prep, genital     Status: Abnormal   Collection Time: 09/20/16 11:53 PM  Result Value Ref Range   Yeast Wet Prep HPF POC PRESENT (A) NONE SEEN   Trich, Wet Prep NONE SEEN NONE SEEN   Clue Cells Wet Prep HPF POC NONE SEEN NONE SEEN   WBC, Wet Prep HPF POC MANY (A) NONE SEEN   Sperm NONE SEEN     MAU Course  Procedures  MDM   Assessment and Plan   1. Vulvovaginal candidiasis   2. [redacted] weeks gestation of pregnancy    DC home Comfort measures reviewed  2nd Trimester precautions  RX: terazol 7 as directed  Return to MAU as needed FU with OB as planned  Follow-up Information    [facility]   Contact information: [address] [place] [id number] [phone]            Tawnya CrookHogan, Bert Givans Donovan 09/21/2016, 12:46 AM

## 2016-09-27 ENCOUNTER — Encounter: Payer: Self-pay | Admitting: Certified Nurse Midwife

## 2016-09-27 ENCOUNTER — Other Ambulatory Visit (HOSPITAL_COMMUNITY)
Admission: RE | Admit: 2016-09-27 | Discharge: 2016-09-27 | Disposition: A | Discharge status: Home / Self Care | Payer: Medicaid Other | Source: Ambulatory Visit | Attending: Certified Nurse Midwife | Admitting: Certified Nurse Midwife

## 2016-09-27 ENCOUNTER — Ambulatory Visit (INDEPENDENT_AMBULATORY_CARE_PROVIDER_SITE_OTHER): Payer: Medicaid Other | Admitting: Certified Nurse Midwife

## 2016-09-27 VITALS — BP 105/71 | HR 85 | Temp 97.8°F | Wt 190.0 lb

## 2016-09-27 DIAGNOSIS — B9689 Other specified bacterial agents as the cause of diseases classified elsewhere: Secondary | ICD-10-CM

## 2016-09-27 DIAGNOSIS — Z01419 Encounter for gynecological examination (general) (routine) without abnormal findings: Secondary | ICD-10-CM | POA: Diagnosis present

## 2016-09-27 DIAGNOSIS — N760 Acute vaginitis: Secondary | ICD-10-CM

## 2016-09-27 DIAGNOSIS — Z3482 Encounter for supervision of other normal pregnancy, second trimester: Secondary | ICD-10-CM | POA: Diagnosis not present

## 2016-09-27 DIAGNOSIS — Z113 Encounter for screening for infections with a predominantly sexual mode of transmission: Secondary | ICD-10-CM | POA: Diagnosis present

## 2016-09-27 DIAGNOSIS — Z3490 Encounter for supervision of normal pregnancy, unspecified, unspecified trimester: Secondary | ICD-10-CM | POA: Insufficient documentation

## 2016-09-27 DIAGNOSIS — B3731 Acute candidiasis of vulva and vagina: Secondary | ICD-10-CM

## 2016-09-27 DIAGNOSIS — Z23 Encounter for immunization: Secondary | ICD-10-CM

## 2016-09-27 DIAGNOSIS — B373 Candidiasis of vulva and vagina: Secondary | ICD-10-CM

## 2016-09-27 MED ORDER — PRENATE PIXIE 10-0.6-0.4-200 MG PO CAPS: 1.0000 | ORAL_CAPSULE | Freq: Every day | ORAL | 12 refills | Status: DC

## 2016-09-27 MED ORDER — TERCONAZOLE 0.8 % VA CREA: 1.0000 | TOPICAL_CREAM | Freq: Every day | VAGINAL | 0 refills | Status: DC

## 2016-09-27 MED ORDER — METRONIDAZOLE 500 MG PO TABS: 500.0000 mg | ORAL_TABLET | Freq: Two times a day (BID) | ORAL | 0 refills | Status: DC

## 2016-09-27 MED ORDER — FLUCONAZOLE 100 MG PO TABS: 100.0000 mg | ORAL_TABLET | Freq: Once | ORAL | 0 refills | Status: AC

## 2016-09-27 NOTE — Progress Notes (Signed)
Subjective:    Rachel Heath is being seen today for her first obstetrical visit.  This is not a planned pregnancy. She is at [redacted]w[redacted]d gestation. Her obstetrical history is significant for obesity. Relationship with FOB: significant other, living together. Patient does intend to breast feed. Pregnancy history fully reviewed.  The information documented in the HPI was reviewed and verified.  Menstrual History: OB History    Gravida Para Term Preterm AB Living   4 1 1   2 1    SAB TAB Ectopic Multiple Live Births   1 1     1        Patient's last menstrual period was 06/16/2016 (exact date). Period Cycle (Days): 28 Period Duration (Days): 5 Period Pattern: Regular Menstrual Flow: Moderate Menstrual Control: Maxi pad  Past Medical History:  Diagnosis Date  . Ganglion cyst    right hand  . Heart murmur    per parent  . Sickle cell trait [facility])     Past Surgical History:  Procedure Laterality Date  . HAND SURGERY       (Not in a hospital admission) No Known Allergies  Social History  Substance Use Topics  . Smoking status: Light Tobacco Smoker  . Smokeless tobacco: Not on file  . Alcohol use No    No family history on file.   Review of Systems Constitutional: negative for weight loss Gastrointestinal: negative for vomiting Genitourinary:negative for genital lesions and vaginal discharge and dysuria Musculoskeletal:negative for back pain Behavioral/Psych: negative for abusive relationship, depression, illegal drug usage and tobacco use    Objective:    BP 105/71   Pulse 85   Temp 97.8 F (36.6 C)   Wt 190 lb (86.2 kg)   LMP 06/16/2016 (Exact Date)   BMI 32.61 kg/m  General Appearance:    Alert, cooperative, no distress, appears stated age  Head:    Normocephalic, without obvious abnormality, atraumatic  Eyes:    PERRL, conjunctiva/corneas clear, EOM's intact, fundi    benign, both eyes  Ears:    Normal TM's and external ear canals, both ears  Nose:   Nares  normal, septum midline, mucosa normal, no drainage    or sinus tenderness  Throat:   Lips, mucosa, and tongue normal; teeth and gums normal  Neck:   Supple, symmetrical, trachea midline, no adenopathy;    thyroid:  no enlargement/tenderness/nodules; no carotid   bruit or JVD  Back:     Symmetric, no curvature, ROM normal, no CVA tenderness  Lungs:     Clear to auscultation bilaterally, respirations unlabored  Chest Wall:    No tenderness or deformity   Heart:    Regular rate and rhythm, S1 and S2 normal, no murmur, rub   or gallop  Breast Exam:    No tenderness, masses, or nipple abnormality  Abdomen:     Soft, non-tender, bowel sounds active all four quadrants,    no masses, no organomegaly  Genitalia:    Normal female without lesion, discharge or tenderness  Extremities:   Extremities normal, atraumatic, no cyanosis or edema  Pulses:   2+ and symmetric all extremities  Skin:   Skin color, texture, turgor normal, no rashes or lesions  Lymph nodes:   Cervical, supraclavicular, and axillary nodes normal  Neurologic:   CNII-XII intact, normal strength, sensation and reflexes    throughout             Cervix:  Long, thick, closed and posterior.  FHR: 149 by doppler.  Lab Review Urine pregnancy test Labs reviewed yes Radiologic studies reviewed no Assessment:    Pregnancy at [redacted]w[redacted]d weeks    Plan:      Prenatal vitamins.  Counseling provided regarding continued use of seat belts, cessation of alcohol consumption, smoking or use of illicit drugs; infection precautions i.e., influenza/TDAP immunizations, toxoplasmosis,CMV, parvovirus, listeria and varicella; workplace safety, exercise during pregnancy; routine dental care, safe medications, sexual activity, hot tubs, saunas, pools, travel, caffeine use, fish and methlymercury, potential toxins, hair treatments, varicose veins Weight gain recommendations per IOM guidelines reviewed: underweight/BMI< 18.5--> gain 28 - 40 lbs; normal  weight/BMI 18.5 - 24.9--> gain 25 - 35 lbs; overweight/BMI 25 - 29.9--> gain 15 - 25 lbs; obese/BMI >30->gain  11 - 20 lbs Problem list reviewed and updated. FIRST/CF mutation testing/NIPT/QUAD SCREEN/fragile X/Ashkenazi Jewish population testing/Spinal muscular atrophy discussed: ordered. Role of ultrasound in pregnancy discussed; fetal survey: ordered. Amniocentesis discussed: not indicated. VBAC calculator score: VBAC consent form provided Meds ordered this encounter  Medications  . Prenat-FeAsp-Meth-FA-DHA w/o A (PRENATE PIXIE) 10-0.6-0.4-200 MG CAPS    Sig: Take 1 tablet by mouth daily.    Dispense:  30 capsule    Refill:  12    Please process coupon: Rx BIN: [id number], RxPCN: [id number], RxGRP: [id number], RxID: [id number]  SUF: 01  . metroNIDAZOLE (FLAGYL) 500 MG tablet    Sig: Take 1 tablet (500 mg total) by mouth 2 (two) times daily.    Dispense:  14 tablet    Refill:  0  . terconazole (TERAZOL 3) 0.8 % vaginal cream    Sig: Place 1 applicator vaginally at bedtime.    Dispense:  20 g    Refill:  0  . fluconazole (DIFLUCAN) 100 MG tablet    Sig: Take 1 tablet (100 mg total) by mouth once. Repeat dose in 48-72 hour.    Dispense:  2 tablet    Refill:  0   Orders Placed This Encounter  Procedures  . Culture, OB Urine  . [place] MFM OB COMP + 14 WK    Standing Status:   Future    Standing Expiration Date:   10/28/2016    Order Specific Question:   Reason for Exam (SYMPTOM  OR DIAGNOSIS REQUIRED)    Answer:   fetal anatomy scan    Order Specific Question:   Preferred imaging location?    Answer:   [facility]  . HIV antibody (with reflex)  . Hemoglobinopathy evaluation  . Varicella zoster antibody, IgG  . Prenatal Profile I  . VITAMIN D 25 Hydroxy (Vit-D Deficiency, Fractures)  . Cystic Fibrosis Mutation 97  . TSH  . MaterniT21 PLUS Core+SCA    Order Specific Question:   Is the patient insulin dependent?    Answer:   No    Order Specific Question:   Please enter gestational  age. This should be expressed as weeks AND days, i.e. 16w 6d. Enter weeks here. Enter days in next question.    Answer:   29    Order Specific Question:   Please enter gestational age. This should be expressed as weeks AND days, i.e. 16w 6d. Enter days here. Enter weeks in previous question.    Answer:   5    Order Specific Question:   How was gestational age calculated?    Answer:   LMP    Order Specific Question:   Please give the date of LMP OR Ultrasound OR Estimated date of delivery.    Answer:   03/23/2017  Order Specific Question:   Number of Fetuses (Type of Pregnancy):    Answer:   1    Order Specific Question:   Indications for performing the test? (please choose all that apply):    Answer:   Routine screening    Order Specific Question:   Other Indications? (Y=Yes, N=No)    Answer:   N    Order Specific Question:   If this is a repeat specimen, please indicate the reason:    Answer:   Not indicated    Order Specific Question:   Please specify the patient's race: (C=White/Caucasion, B=Black, I=Native American, A=Asian, H=Hispanic, O=Other, U=Unknown)    Answer:   B    Order Specific Question:   Donor Egg - indicate if the egg was obtained from in vitro fertilization.    Answer:   N    Order Specific Question:   Age of Egg Donor.    Answer:   [age]    Order Specific Question:   Prior Down Syndrome/ONTD screening during current pregnancy.    Answer:   N    Order Specific Question:   Prior First Trimester Testing    Answer:   N    Order Specific Question:   Prior Second Trimester Testing    Answer:   N    Order Specific Question:   Family History of Neural Tube Defects    Answer:   N    Order Specific Question:   Prior Pregnancy with Down Syndrome    Answer:   N    Order Specific Question:   Please give the patient's weight (in pounds)    Answer:   190  . ToxASSURE Select 13 (MW), Urine  . Hemoglobin A1c    Follow up in 4 weeks. 50% of 30 min visit spent on counseling and  coordination of care.

## 2016-09-28 ENCOUNTER — Other Ambulatory Visit: Payer: Self-pay | Admitting: Certified Nurse Midwife

## 2016-09-28 LAB — VITAMIN D 25 HYDROXY (VIT D DEFICIENCY, FRACTURES): VIT D 25 HYDROXY: 21.5 ng/mL — AB (ref 30.0–100.0)

## 2016-09-28 LAB — CERVICOVAGINAL ANCILLARY ONLY
CHLAMYDIA, DNA PROBE: NEGATIVE
NEISSERIA GONORRHEA: NEGATIVE
Trichomonas: NEGATIVE

## 2016-09-28 LAB — CYTOLOGY - PAP

## 2016-09-29 LAB — URINE CULTURE, OB REFLEX

## 2016-09-29 LAB — CULTURE, OB URINE

## 2016-09-30 LAB — CERVICOVAGINAL ANCILLARY ONLY
BACTERIAL VAGINITIS: POSITIVE — AB
Candida vaginitis: NEGATIVE

## 2016-10-01 ENCOUNTER — Other Ambulatory Visit: Payer: Self-pay | Admitting: Certified Nurse Midwife

## 2016-10-03 LAB — MATERNIT21 PLUS CORE+SCA
Chromosome 13: NEGATIVE
Chromosome 18: NEGATIVE
Chromosome 21: NEGATIVE
PDF: 0
Y CHROMOSOME: DETECTED

## 2016-10-03 LAB — PRENATAL PROFILE I(LABCORP)
Antibody Screen: NEGATIVE
BASOS ABS: 0 10*3/uL (ref 0.0–0.2)
Basos: 0 %
EOS (ABSOLUTE): 0.1 10*3/uL (ref 0.0–0.4)
Eos: 1 %
Hematocrit: 35 % (ref 34.0–46.6)
Hemoglobin: 11.4 g/dL (ref 11.1–15.9)
Hepatitis B Surface Ag: NEGATIVE
IMMATURE GRANS (ABS): 0 10*3/uL (ref 0.0–0.1)
Immature Granulocytes: 0 %
LYMPHS: 23 %
Lymphocytes Absolute: 1.7 10*3/uL (ref 0.7–3.1)
MCH: 26.6 pg (ref 26.6–33.0)
MCHC: 32.6 g/dL (ref 31.5–35.7)
MCV: 82 fL (ref 79–97)
MONOCYTES: 7 %
Monocytes Absolute: 0.5 10*3/uL (ref 0.1–0.9)
NEUTROS ABS: 5 10*3/uL (ref 1.4–7.0)
NEUTROS PCT: 69 %
PLATELETS: 347 10*3/uL (ref 150–379)
RBC: 4.29 x10E6/uL (ref 3.77–5.28)
RDW: 16 % — AB (ref 12.3–15.4)
RPR Ser Ql: NONREACTIVE
Rh Factor: NEGATIVE
Rubella Antibodies, IGG: 2.67 index (ref >0.99)
WBC: 7.3 10*3/uL (ref 3.4–10.8)

## 2016-10-03 LAB — TSH: TSH: 1.8 u[IU]/mL (ref 0.450–4.500)

## 2016-10-03 LAB — HIV ANTIBODY (ROUTINE TESTING W REFLEX): HIV SCREEN 4TH GENERATION: NONREACTIVE

## 2016-10-03 LAB — HEMOGLOBINOPATHY EVALUATION
HEMOGLOBIN F QUANTITATION: 0 % (ref 0.0–2.0)
HGB C: 0 %
HGB S: 39 % — ABNORMAL HIGH
Hemoglobin A2 Quantitation: 3.5 % — ABNORMAL HIGH (ref 0.7–3.1)
Hgb A: 57.5 % — ABNORMAL LOW (ref 94.0–98.0)

## 2016-10-03 LAB — CYSTIC FIBROSIS MUTATION 97: GENE DIS ANAL CARRIER INTERP BLD/T-IMP: NOT DETECTED

## 2016-10-03 LAB — HEMOGLOBIN A1C
Est. average glucose Bld gHb Est-mCnc: 100 mg/dL
Hgb A1c MFr Bld: 5.1 % (ref 4.8–5.6)

## 2016-10-03 LAB — VARICELLA ZOSTER ANTIBODY, IGG: Varicella zoster IgG: 816 index (ref >165)

## 2016-10-03 LAB — TOXASSURE SELECT 13 (MW), URINE

## 2016-10-04 ENCOUNTER — Other Ambulatory Visit: Payer: Self-pay | Admitting: Certified Nurse Midwife

## 2016-10-04 DIAGNOSIS — R7989 Other specified abnormal findings of blood chemistry: Secondary | ICD-10-CM | POA: Insufficient documentation

## 2016-10-06 ENCOUNTER — Encounter (HOSPITAL_COMMUNITY): Payer: Self-pay | Admitting: Certified Nurse Midwife

## 2016-10-18 ENCOUNTER — Ambulatory Visit (HOSPITAL_COMMUNITY)
Admission: RE | Admit: 2016-10-18 | Discharge: 2016-10-18 | Disposition: A | Discharge status: Home / Self Care | Payer: Medicaid Other | Source: Ambulatory Visit | Attending: Certified Nurse Midwife | Admitting: Certified Nurse Midwife

## 2016-10-18 DIAGNOSIS — O99012 Anemia complicating pregnancy, second trimester: Secondary | ICD-10-CM | POA: Diagnosis not present

## 2016-10-18 DIAGNOSIS — Z363 Encounter for antenatal screening for malformations: Secondary | ICD-10-CM | POA: Insufficient documentation

## 2016-10-18 DIAGNOSIS — O99332 Smoking (tobacco) complicating pregnancy, second trimester: Secondary | ICD-10-CM | POA: Diagnosis not present

## 2016-10-18 DIAGNOSIS — Z3A17 17 weeks gestation of pregnancy: Secondary | ICD-10-CM | POA: Diagnosis not present

## 2016-10-18 DIAGNOSIS — D573 Sickle-cell trait: Secondary | ICD-10-CM | POA: Insufficient documentation

## 2016-10-18 DIAGNOSIS — Z3490 Encounter for supervision of normal pregnancy, unspecified, unspecified trimester: Secondary | ICD-10-CM

## 2016-10-25 ENCOUNTER — Ambulatory Visit (INDEPENDENT_AMBULATORY_CARE_PROVIDER_SITE_OTHER): Payer: Medicaid Other | Admitting: Certified Nurse Midwife

## 2016-10-25 DIAGNOSIS — Z3490 Encounter for supervision of normal pregnancy, unspecified, unspecified trimester: Secondary | ICD-10-CM

## 2016-10-25 DIAGNOSIS — Z6791 Unspecified blood type, Rh negative: Secondary | ICD-10-CM | POA: Insufficient documentation

## 2016-10-25 DIAGNOSIS — D573 Sickle-cell trait: Secondary | ICD-10-CM

## 2016-10-25 DIAGNOSIS — O99012 Anemia complicating pregnancy, second trimester: Secondary | ICD-10-CM

## 2016-10-25 DIAGNOSIS — O26899 Other specified pregnancy related conditions, unspecified trimester: Secondary | ICD-10-CM

## 2016-10-25 NOTE — Progress Notes (Signed)
Subjective:    Rachel Heath is a 25 y.o. female being seen today for her obstetrical visit. She is at 4875w5d gestation. Patient reports: no complaints . Fetal movement: normal.  Doing well.  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   Problem List Items Addressed This Visit      Other   Supervision of normal pregnancy, antepartum   Relevant Orders   US MFM OB FOLLOW UP   Sickle cell trait (HCC)   Rh negative state in antepartum period     Patient Active Problem List   Diagnosis Date Noted  . Sickle cell trait (HCC) 10/25/2016  . Rh negative state in antepartum period 10/25/2016  . Low vitamin D level 10/04/2016  . Supervision of normal pregnancy, antepartum 09/27/2016  . NSVD (normal spontaneous vaginal delivery) 04/19/2014   Objective:    BP 107/70   Pulse 87   Wt 194 lb (88 kg)   LMP 06/16/2016 (Exact Date)   BMI 33.30 kg/m  FHT: 139 BPM  Uterine Size: 20 cm, size equals dates and at U     Assessment:    Pregnancy @ 8275w5d    RH- status  Sickle cell trait  Plan:    OBGCT: discussed. Signs and symptoms of preterm labor: discussed.  Labs, problem list reviewed and updated 2 hr GTT planned Follow up in 4 weeks.

## 2016-10-28 ENCOUNTER — Other Ambulatory Visit: Payer: Self-pay | Admitting: Certified Nurse Midwife

## 2016-11-22 ENCOUNTER — Ambulatory Visit (INDEPENDENT_AMBULATORY_CARE_PROVIDER_SITE_OTHER): Payer: Medicaid Other | Admitting: Certified Nurse Midwife

## 2016-11-22 VITALS — BP 105/71 | HR 81 | Wt 195.0 lb

## 2016-11-22 DIAGNOSIS — D573 Sickle-cell trait: Secondary | ICD-10-CM

## 2016-11-22 DIAGNOSIS — Z3480 Encounter for supervision of other normal pregnancy, unspecified trimester: Secondary | ICD-10-CM

## 2016-11-22 DIAGNOSIS — O26899 Other specified pregnancy related conditions, unspecified trimester: Secondary | ICD-10-CM

## 2016-11-22 DIAGNOSIS — O99012 Anemia complicating pregnancy, second trimester: Secondary | ICD-10-CM

## 2016-11-22 DIAGNOSIS — R7989 Other specified abnormal findings of blood chemistry: Secondary | ICD-10-CM

## 2016-11-22 DIAGNOSIS — Z6791 Unspecified blood type, Rh negative: Secondary | ICD-10-CM

## 2016-11-22 DIAGNOSIS — O360920 Maternal care for other rhesus isoimmunization, second trimester, not applicable or unspecified: Secondary | ICD-10-CM

## 2016-11-22 NOTE — Progress Notes (Signed)
Subjective:    Jonette Pesayeasha M Rotter is a 25 y.o. female being seen today for her obstetrical visit. She is at 3263w5d gestation. Patient reports: no complaints . Fetal movement: normal.  Problem List Items Addressed This Visit      Other   Supervision of normal pregnancy, antepartum - Primary   Low vitamin D level   Sickle cell trait (HCC)   Rh negative state in antepartum period     Patient Active Problem List   Diagnosis Date Noted  . Sickle cell trait (HCC) 10/25/2016  . Rh negative state in antepartum period 10/25/2016  . Low vitamin D level 10/04/2016  . Supervision of normal pregnancy, antepartum 09/27/2016   Objective:    BP 105/71   Pulse 81   Wt 195 lb (88.5 kg)   LMP 06/16/2016 (Exact Date)   BMI 33.47 kg/m  FHT: 144 BPM  Uterine Size: 23 cm and size equals dates     Assessment:    Pregnancy @ 6663w5d    Doing well  Plan:   f/u us for fetal anatomy on December 12th scheduled   OBGCT: discussed and ordered for next visit. Signs and symptoms of preterm labor: discussed.  Labs, problem list reviewed and updated 2 hr GTT planned Follow up in 4 weeks.

## 2016-11-25 ENCOUNTER — Encounter: Payer: Self-pay | Admitting: *Deleted

## 2016-12-01 ENCOUNTER — Ambulatory Visit (HOSPITAL_COMMUNITY)
Admission: RE | Admit: 2016-12-01 | Discharge: 2016-12-01 | Disposition: A | Discharge status: Home / Self Care | Payer: Medicaid Other | Source: Ambulatory Visit | Attending: Certified Nurse Midwife | Admitting: Certified Nurse Midwife

## 2016-12-01 DIAGNOSIS — Z362 Encounter for other antenatal screening follow-up: Secondary | ICD-10-CM | POA: Insufficient documentation

## 2016-12-01 DIAGNOSIS — Z3A24 24 weeks gestation of pregnancy: Secondary | ICD-10-CM | POA: Diagnosis not present

## 2016-12-01 DIAGNOSIS — Z3490 Encounter for supervision of normal pregnancy, unspecified, unspecified trimester: Secondary | ICD-10-CM

## 2016-12-01 DIAGNOSIS — Z862 Personal history of diseases of the blood and blood-forming organs and certain disorders involving the immune mechanism: Secondary | ICD-10-CM | POA: Insufficient documentation

## 2016-12-01 DIAGNOSIS — O99332 Smoking (tobacco) complicating pregnancy, second trimester: Secondary | ICD-10-CM | POA: Insufficient documentation

## 2016-12-03 ENCOUNTER — Encounter (HOSPITAL_COMMUNITY): Payer: Self-pay

## 2016-12-03 ENCOUNTER — Other Ambulatory Visit: Payer: Self-pay | Admitting: Certified Nurse Midwife

## 2016-12-03 ENCOUNTER — Inpatient Hospital Stay (HOSPITAL_COMMUNITY)
Admission: AD | Admit: 2016-12-03 | Discharge: 2016-12-03 | Disposition: A | Discharge status: Home / Self Care | Payer: Medicaid Other | Source: Ambulatory Visit | Attending: Obstetrics and Gynecology | Admitting: Obstetrics and Gynecology

## 2016-12-03 DIAGNOSIS — O99612 Diseases of the digestive system complicating pregnancy, second trimester: Secondary | ICD-10-CM | POA: Insufficient documentation

## 2016-12-03 DIAGNOSIS — K529 Noninfective gastroenteritis and colitis, unspecified: Secondary | ICD-10-CM | POA: Diagnosis not present

## 2016-12-03 DIAGNOSIS — Z87891 Personal history of nicotine dependence: Secondary | ICD-10-CM | POA: Insufficient documentation

## 2016-12-03 DIAGNOSIS — S39012A Strain of muscle, fascia and tendon of lower back, initial encounter: Secondary | ICD-10-CM | POA: Insufficient documentation

## 2016-12-03 DIAGNOSIS — M545 Low back pain: Secondary | ICD-10-CM | POA: Diagnosis present

## 2016-12-03 DIAGNOSIS — Z3A24 24 weeks gestation of pregnancy: Secondary | ICD-10-CM | POA: Diagnosis not present

## 2016-12-03 DIAGNOSIS — Z3480 Encounter for supervision of other normal pregnancy, unspecified trimester: Secondary | ICD-10-CM

## 2016-12-03 DIAGNOSIS — O26892 Other specified pregnancy related conditions, second trimester: Secondary | ICD-10-CM | POA: Diagnosis not present

## 2016-12-03 DIAGNOSIS — O99891 Other specified diseases and conditions complicating pregnancy: Secondary | ICD-10-CM

## 2016-12-03 DIAGNOSIS — O9989 Other specified diseases and conditions complicating pregnancy, childbirth and the puerperium: Secondary | ICD-10-CM

## 2016-12-03 DIAGNOSIS — M549 Dorsalgia, unspecified: Secondary | ICD-10-CM

## 2016-12-03 LAB — URINALYSIS, ROUTINE W REFLEX MICROSCOPIC
BILIRUBIN URINE: NEGATIVE
GLUCOSE, UA: NEGATIVE mg/dL
HGB URINE DIPSTICK: NEGATIVE
Ketones, ur: NEGATIVE mg/dL
Leukocytes, UA: NEGATIVE
Nitrite: NEGATIVE
Protein, ur: NEGATIVE mg/dL
SPECIFIC GRAVITY, URINE: 1.009 (ref 1.005–1.030)
pH: 8 (ref 5.0–8.0)

## 2016-12-03 MED ORDER — CYCLOBENZAPRINE HCL 5 MG PO TABS
5.0000 mg | ORAL_TABLET | Freq: Once | ORAL | Status: AC
  Administered 2016-12-03: 5 mg via ORAL
  Filled 2016-12-03: qty 1

## 2016-12-03 MED ORDER — CYCLOBENZAPRINE HCL 5 MG PO TABS: 5.0000 mg | ORAL_TABLET | Freq: Three times a day (TID) | ORAL | 0 refills | Status: DC | PRN

## 2016-12-03 NOTE — MAU Provider Note (Signed)
Chief Complaint:  Back Pain   Seen at 1915 hrs   HPI: Rachel Heath is a 25 y.o. [id number] at 8224w2dwho presents to maternity admissions reporting low back pain since 3pm today.  States vomited and then had 3 bouts of diarrhea.  Laid on floor to cool off then got into bed and noticed her back was hurting.  Ate ribs at [employer] last night and ate more of them this morning. . She reports good fetal movement, denies LOF, vaginal bleeding, vaginal itching/burning, urinary symptoms, h/a, dizziness, constipation or fever/chills.    Back Pain  This is a new problem. The current episode started today. The problem occurs intermittently. The problem is unchanged. The pain is present in the lumbar spine. The quality of the pain is described as aching. The pain does not radiate. The pain is mild. The pain is the same all the time. Pertinent negatives include no abdominal pain, chest pain, dysuria, fever, headaches, leg pain, numbness, paresis, pelvic pain, tingling or weakness. She has tried nothing for the symptoms.   RN Note: Pt presents to MAU with complaints of lower back pain since approximately 3 today. Denies any vaginal bleeding or abnormal discharge  Past Medical History: Past Medical History:  Diagnosis Date  . Ganglion cyst    right hand  . Heart murmur    per parent  . Sickle cell trait (HCC)     Past obstetric history: OB History  Gravida Para Term Preterm AB Living  4 1 1   2 1   SAB TAB Ectopic Multiple Live Births  1 1     1     # Outcome Date GA Lbr Len/2nd Weight Sex Delivery Anes PTL Lv  4 Current           3 Term 04/19/14 2846w0d 09:59 / 02:18 6 lb 8 oz (2.948 kg) M Vag-Spont EPI  LIV  2 TAB           1 SAB               Past Surgical History: Past Surgical History:  Procedure Laterality Date  . HAND SURGERY      Family History: History reviewed. No pertinent family history.  Social History: Social History  Substance Use Topics  . Smoking status: Former Games developermoker  .  Smokeless tobacco: Never Used  . Alcohol use No    Allergies: No Known Allergies  Meds:  Prescriptions Prior to Admission  Medication Sig Dispense Refill Last Dose  . Prenat-FeAsp-Meth-FA-DHA w/o A (PRENATE PIXIE) 10-0.6-0.4-200 MG CAPS Take 1 tablet by mouth daily. 30 capsule 12 12/02/2016 at Unknown time  . metroNIDAZOLE (FLAGYL) 500 MG tablet Take 1 tablet (500 mg total) by mouth 2 (two) times daily. (Patient not taking: Reported on 12/03/2016) 14 tablet 0 Not Taking at Unknown time  . terconazole (TERAZOL 3) 0.8 % vaginal cream Place 1 applicator vaginally at bedtime. (Patient not taking: Reported on 12/03/2016) 20 g 0 Not Taking at Unknown time    I have reviewed patient's Past Medical Hx, Surgical Hx, Family Hx, Social Hx, medications and allergies.   ROS:  Review of Systems  Constitutional: Negative for fever.  Cardiovascular: Negative for chest pain.  Gastrointestinal: Negative for abdominal pain.  Genitourinary: Negative for dysuria and pelvic pain.  Musculoskeletal: Positive for back pain.  Neurological: Negative for tingling, weakness, numbness and headaches.   Other systems negative  Physical Exam  Patient Vitals for the past 24 hrs:  BP Temp Pulse Resp  Height Weight  12/03/16 1854 118/73 97.9 F (36.6 C) 94 18 5\' 4"  (1.626 m) 197 lb (89.4 kg)   Constitutional: Well-developed, well-nourished female in no acute distress.  Cardiovascular: normal rate and rhythm Respiratory: normal effort, clear to auscultation bilaterally GI: Abd soft, non-tender, gravid appropriate for gestational age.   No rebound or guarding. MS: Extremities nontender, no edema, normal ROM   Some tenderness to lumbar spine. Neurologic: Alert and oriented x 4.  GU: Neg CVAT.  PELVIC EXAM: deferred  FHT:  Baseline 140 , moderate variability, accelerations present, no decelerations Contractions: Rare   Labs: Results for orders placed or performed during the hospital encounter of 12/03/16 (from  the past 24 hour(s))  Urinalysis, Routine w reflex microscopic     Status: None   Collection Time: 12/03/16  6:55 PM  Result Value Ref Range   Color, Urine YELLOW YELLOW   APPearance CLEAR CLEAR   Specific Gravity, Urine 1.009 1.005 - 1.030   pH 8.0 5.0 - 8.0   Glucose, UA NEGATIVE NEGATIVE mg/dL   Hgb urine dipstick NEGATIVE NEGATIVE   Bilirubin Urine NEGATIVE NEGATIVE   Ketones, ur NEGATIVE NEGATIVE mg/dL   Protein, ur NEGATIVE NEGATIVE mg/dL   Nitrite NEGATIVE NEGATIVE   Leukocytes, UA NEGATIVE NEGATIVE   O/Negative/-- (10/09 1113)  Imaging:  [place] Mfm Ob Follow Up  Result Date: 12/01/2016 OBSTETRICAL ULTRASOUND: This exam was performed within a [facility]. The OB US report was generated in the AS system, and faxed to the ordering physician.  This report is available in the [facility] [employer] PACS. See the AS Obstetric US report via the Image Link.   MAU Course/MDM: I have ordered labs and reviewed results.  NST reviewed  Flexeril given with some relief Soon thereafter, patient asked to go home as her ride was here Discussed I wanted to make sure she was better and she stated she was better and wanted to go Assured she can come back PRN  Assessment: SIUP at 275w2d Low back strain Gastroenteritis likely due to high fat intake vs food poisoning (no symptoms while here, tolerated POs)  Plan: Discharge home Rx given for Flexeril for PRN use, driving precautions Preterm Labor precautions and fetal kick counts Follow up in Office for prenatal visits and recheck of status   Pt stable at time of discharge.  Wynelle BourgeoisMarie Kamani Lewter CNM, MSN Certified Nurse-Midwife 12/03/2016 8:34 PM

## 2016-12-03 NOTE — Discharge Instructions (Signed)
Back Pain, Adult Introduction Back pain is very common. The pain often gets better over time. The cause of back pain is usually not dangerous. Most people can learn to manage their back pain on their own. Follow these instructions at home: Watch your back pain for any changes. The following actions may help to lessen any pain you are feeling:  Stay active. Start with short walks on flat ground if you can. Try to walk farther each day.  Exercise regularly as told by your doctor. Exercise helps your back heal faster. It also helps avoid future injury by keeping your muscles strong and flexible.  Do not sit, drive, or stand in one place for more than 30 minutes.  Do not stay in bed. Resting more than 1-2 days can slow down your recovery.  Be careful when you bend or lift an object. Use good form when lifting:  Bend at your knees.  Keep the object close to your body.  Do not twist.  Sleep on a firm mattress. Lie on your side, and bend your knees. If you lie on your back, put a pillow under your knees.  Take medicines only as told by your doctor.  Put ice on the injured area.  Put ice in a plastic bag.  Place a towel between your skin and the bag.  Leave the ice on for 20 minutes, 2-3 times a day for the first 2-3 days. After that, you can switch between ice and heat packs.  Avoid feeling anxious or stressed. Find good ways to deal with stress, such as exercise.  Maintain a healthy weight. Extra weight puts stress on your back. Contact a doctor if:  You have pain that does not go away with rest or medicine.  You have worsening pain that goes down into your legs or buttocks.  You have pain that does not get better in one week.  You have pain at night.  You lose weight.  You have a fever or chills. Get help right away if:  You cannot control when you poop (bowel movement) or pee (urinate).  Your arms or legs feel weak.  Your arms or legs lose feeling  (numbness).  You feel sick to your stomach (nauseous) or throw up (vomit).  You have belly (abdominal) pain.  You feel like you may pass out (faint). This information is not intended to replace advice given to you by your health care provider. Make sure you discuss any questions you have with your health care provider. Document Released: 05/24/2008 Document Revised: 05/13/2016 Document Reviewed: 04/09/2014  2017 Elsevier  

## 2016-12-03 NOTE — MAU Note (Signed)
Pt presents to MAU with complaints of lower back pain since approximately 3 today. Denies any vaginal bleeding or abnormal discharge

## 2016-12-20 NOTE — L&D Delivery Note (Signed)
Delivery Note At 3:03 PM a viable female was delivered via Vaginal, Spontaneous Delivery (Presentation: OA to LOA).  APGAR: 8, 9; weight pending  .  Infant placed directly on maternal abdomen; dried and stimulated. Cord cut and clamped after 60 seconds.  Placenta status: delivered intact with gentle traction.  Cord: 3 VC  with the following complications: none .  Cord pH: nA  Anesthesia:  Epidural Episiotomy:  none Lacerations:  1st degree perineal Suture Repair: hemostatic Est. Blood Loss (mL):  200  Mom to postpartum.  Baby to Nursery.  Charlesetta Garibaldi Kooistra CNM 03/18/2017, 3:19 PM   Attestation of Attending Supervision of Advanced Practice Provider (PA/CNM/NP): Evaluation and management procedures were performed by the Advanced Practice Provider under my supervision and collaboration.  I have reviewed the Advanced Practice Provider's note and chart, and I agree with the management and plan.  Jaynie Collins, MD, FACOG Attending Obstetrician & Gynecologist Faculty Practice, [facility]

## 2016-12-21 ENCOUNTER — Ambulatory Visit (INDEPENDENT_AMBULATORY_CARE_PROVIDER_SITE_OTHER): Payer: Medicaid Other | Admitting: Obstetrics

## 2016-12-21 ENCOUNTER — Other Ambulatory Visit: Payer: Medicaid Other

## 2016-12-21 ENCOUNTER — Encounter: Payer: Self-pay | Admitting: Obstetrics

## 2016-12-21 VITALS — BP 111/70 | HR 84 | Wt 198.0 lb

## 2016-12-21 DIAGNOSIS — Z3482 Encounter for supervision of other normal pregnancy, second trimester: Secondary | ICD-10-CM

## 2016-12-21 DIAGNOSIS — Z3480 Encounter for supervision of other normal pregnancy, unspecified trimester: Secondary | ICD-10-CM

## 2016-12-21 DIAGNOSIS — Z23 Encounter for immunization: Secondary | ICD-10-CM | POA: Diagnosis not present

## 2016-12-21 DIAGNOSIS — O360920 Maternal care for other rhesus isoimmunization, second trimester, not applicable or unspecified: Secondary | ICD-10-CM

## 2016-12-21 DIAGNOSIS — Z3490 Encounter for supervision of normal pregnancy, unspecified, unspecified trimester: Secondary | ICD-10-CM

## 2016-12-21 MED ORDER — TETANUS-DIPHTH-ACELL PERTUSSIS 5-2.5-18.5 LF-MCG/0.5 IM SUSP: 0.5000 mL | Freq: Once | INTRAMUSCULAR | Status: DC

## 2016-12-21 NOTE — Progress Notes (Signed)
Patient doing her 2 hr gtt today. Armandina StammerJennifer Howard RNBSN

## 2016-12-22 LAB — CBC
HEMATOCRIT: 31.3 % — AB (ref 34.0–46.6)
HEMOGLOBIN: 10.4 g/dL — AB (ref 11.1–15.9)
MCH: 28.6 pg (ref 26.6–33.0)
MCHC: 33.2 g/dL (ref 31.5–35.7)
MCV: 86 fL (ref 79–97)
Platelets: 313 10*3/uL (ref 150–379)
RBC: 3.64 x10E6/uL — AB (ref 3.77–5.28)
RDW: 14 % (ref 12.3–15.4)
WBC: 6 10*3/uL (ref 3.4–10.8)

## 2016-12-22 LAB — GLUCOSE TOLERANCE, 2 HOURS W/ 1HR
GLUCOSE, 1 HOUR: 99 mg/dL (ref 65–179)
Glucose, 2 hour: 75 mg/dL (ref 65–152)
Glucose, Fasting: 71 mg/dL (ref 65–91)

## 2016-12-22 LAB — RPR: RPR Ser Ql: NONREACTIVE

## 2016-12-22 LAB — HIV ANTIBODY (ROUTINE TESTING W REFLEX): HIV SCREEN 4TH GENERATION: NONREACTIVE

## 2016-12-25 LAB — CULTURE, URINE COMPREHENSIVE

## 2017-01-04 ENCOUNTER — Ambulatory Visit (INDEPENDENT_AMBULATORY_CARE_PROVIDER_SITE_OTHER): Payer: Medicaid Other | Admitting: Certified Nurse Midwife

## 2017-01-04 VITALS — BP 117/75 | HR 90 | Temp 97.6°F | Wt 196.0 lb

## 2017-01-04 DIAGNOSIS — R7989 Other specified abnormal findings of blood chemistry: Secondary | ICD-10-CM

## 2017-01-04 DIAGNOSIS — Z6791 Unspecified blood type, Rh negative: Secondary | ICD-10-CM

## 2017-01-04 DIAGNOSIS — Z3480 Encounter for supervision of other normal pregnancy, unspecified trimester: Secondary | ICD-10-CM

## 2017-01-04 DIAGNOSIS — O99013 Anemia complicating pregnancy, third trimester: Secondary | ICD-10-CM

## 2017-01-04 DIAGNOSIS — R519 Headache, unspecified: Secondary | ICD-10-CM

## 2017-01-04 DIAGNOSIS — O26893 Other specified pregnancy related conditions, third trimester: Secondary | ICD-10-CM

## 2017-01-04 DIAGNOSIS — D573 Sickle-cell trait: Secondary | ICD-10-CM

## 2017-01-04 DIAGNOSIS — E559 Vitamin D deficiency, unspecified: Secondary | ICD-10-CM

## 2017-01-04 DIAGNOSIS — R51 Headache: Secondary | ICD-10-CM

## 2017-01-04 DIAGNOSIS — O26899 Other specified pregnancy related conditions, unspecified trimester: Secondary | ICD-10-CM

## 2017-01-04 MED ORDER — BUTALBITAL-APAP-CAFFEINE 50-325-40 MG PO TABS: 1.0000 | ORAL_TABLET | Freq: Four times a day (QID) | ORAL | 4 refills | Status: DC | PRN

## 2017-01-04 NOTE — Progress Notes (Signed)
Patient c/o frequent headaches x 3 days.

## 2017-01-04 NOTE — Progress Notes (Signed)
   PRENATAL VISIT NOTE  Subjective:  Rachel Heath is a 26 y.o. [phone] at 1737w6d being seen today for ongoing prenatal care.  She is currently monitored for the following issues for this low-risk pregnancy and has Supervision of normal pregnancy, antepartum; Low vitamin D level; Sickle cell trait (HCC); and Rh negative state in antepartum period on her problem list.  Patient reports headache, no bleeding, no contractions, no cramping and no leaking.  Contractions: Not present. Vag. Bleeding: None.  Movement: Present. Denies leaking of fluid.   The following portions of the patient's history were reviewed and updated as appropriate: allergies, current medications, past family history, past medical history, past social history, past surgical history and problem list. Problem list updated.  Objective:   Vitals:   01/04/17 1110  BP: 117/75  Pulse: 90  Temp: 97.6 F (36.4 C)  Weight: 196 lb (88.9 kg)    Fetal Status: Fetal Heart Rate (bpm): 143 Fundal Height: 28 cm Movement: Present     General:  Alert, oriented and cooperative. Patient is in no acute distress.  Skin: Skin is warm and dry. No rash noted.   Cardiovascular: Normal heart rate noted  Respiratory: Normal respiratory effort, no problems with respiration noted  Abdomen: Soft, gravid, appropriate for gestational age. Pain/Pressure: Absent     Pelvic:  Cervical exam deferred        Extremities: Normal range of motion.  Edema: Trace  Mental Status: Normal mood and affect. Normal behavior. Normal judgment and thought content.   Assessment and Plan:  Pregnancy: G4P1021 at 8537w6d  1. Low vitamin D level     21.5 on 09/27/16  2. Rh negative state in antepartum period     Given 12/21/16  3. Sickle cell trait (HCC)      Last UC was 12/21/16  4. Supervision of other normal pregnancy, antepartum     HA in pregnancy, normotensive. Fioricet given.   Preterm labor symptoms and general obstetric precautions including but not limited  to vaginal bleeding, contractions, leaking of fluid and fetal movement were reviewed in detail with the patient. Please refer to After Visit Summary for other counseling recommendations.  Return in about 2 weeks (around 01/18/2017) for ROB.   Roe Coombsachelle A Nyelli Samara, CNM

## 2017-01-18 ENCOUNTER — Ambulatory Visit (INDEPENDENT_AMBULATORY_CARE_PROVIDER_SITE_OTHER): Payer: Medicaid Other | Admitting: Obstetrics

## 2017-01-18 ENCOUNTER — Encounter: Payer: Self-pay | Admitting: Obstetrics

## 2017-01-18 VITALS — BP 129/78 | HR 83 | Wt 195.0 lb

## 2017-01-18 DIAGNOSIS — Z3483 Encounter for supervision of other normal pregnancy, third trimester: Secondary | ICD-10-CM

## 2017-01-18 DIAGNOSIS — Z3480 Encounter for supervision of other normal pregnancy, unspecified trimester: Secondary | ICD-10-CM

## 2017-01-18 NOTE — Progress Notes (Signed)
Subjective:  Rachel Heath is a 26 y.o. [phone] at 1665w6d being seen today for ongoing prenatal care.  She is currently monitored for the following issues for this low-risk pregnancy and has Supervision of normal pregnancy, antepartum; Low vitamin D level; Sickle cell trait (HCC); and Rh negative state in antepartum period on her problem list.  Patient reports no complaints.  Contractions: Not present. Vag. Bleeding: None.  Movement: Present. Denies leaking of fluid.   The following portions of the patient's history were reviewed and updated as appropriate: allergies, current medications, past family history, past medical history, past social history, past surgical history and problem list. Problem list updated.  Objective:   Vitals:   01/18/17 1001  BP: 129/78  Pulse: 83  Weight: 195 lb (88.5 kg)    Fetal Status: Fetal Heart Rate (bpm): 140   Movement: Present     General:  Alert, oriented and cooperative. Patient is in no acute distress.  Skin: Skin is warm and dry. No rash noted.   Cardiovascular: Normal heart rate noted  Respiratory: Normal respiratory effort, no problems with respiration noted  Abdomen: Soft, gravid, appropriate for gestational age. Pain/Pressure: Absent     Pelvic:  Cervical exam deferred        Extremities: Normal range of motion.  Edema: None  Mental Status: Normal mood and affect. Normal behavior. Normal judgment and thought content.   Urinalysis:      Assessment and Plan:  Pregnancy: [id number] at 8865w6d  There are no diagnoses linked to this encounter. Preterm labor symptoms and general obstetric precautions including but not limited to vaginal bleeding, contractions, leaking of fluid and fetal movement were reviewed in detail with the patient. Please refer to After Visit Summary for other counseling recommendations.  No Follow-up on file.   Brock Badharles A Miroslav Gin, MDPatient ID: Rachel Heath, female   DOB: 08/30/1991, 26 y.o.   MRN: [id number]

## 2017-02-01 ENCOUNTER — Ambulatory Visit (INDEPENDENT_AMBULATORY_CARE_PROVIDER_SITE_OTHER): Payer: Medicaid Other | Admitting: Certified Nurse Midwife

## 2017-02-01 VITALS — BP 101/68 | HR 86 | Wt 194.0 lb

## 2017-02-01 DIAGNOSIS — Z3480 Encounter for supervision of other normal pregnancy, unspecified trimester: Secondary | ICD-10-CM

## 2017-02-01 DIAGNOSIS — D573 Sickle-cell trait: Secondary | ICD-10-CM

## 2017-02-01 DIAGNOSIS — E559 Vitamin D deficiency, unspecified: Secondary | ICD-10-CM

## 2017-02-01 DIAGNOSIS — R7989 Other specified abnormal findings of blood chemistry: Secondary | ICD-10-CM

## 2017-02-01 DIAGNOSIS — O99013 Anemia complicating pregnancy, third trimester: Secondary | ICD-10-CM

## 2017-02-01 MED ORDER — VITAMIN D (ERGOCALCIFEROL) 1.25 MG (50000 UNIT) PO CAPS: 50000.0000 [IU] | ORAL_CAPSULE | ORAL | 2 refills | Status: DC

## 2017-02-01 NOTE — Progress Notes (Signed)
Pt states back pain and pelvic pressure.

## 2017-02-01 NOTE — Progress Notes (Signed)
   PRENATAL VISIT NOTE  Subjective:  Rachel Heath is a 26 y.o. [phone] at 4638w6d being seen today for ongoing prenatal care.  She is currently monitored for the following issues for this low-risk pregnancy and has Supervision of normal pregnancy, antepartum; Low vitamin D level; Sickle cell trait (HCC); and Rh negative state in antepartum period on her problem list.  Patient reports backache, no bleeding, no cramping, no leaking and occasional contractions.  Contractions: Not present. Vag. Bleeding: None.  Movement: Present. Denies leaking of fluid.   The following portions of the patient's history were reviewed and updated as appropriate: allergies, current medications, past family history, past medical history, past social history, past surgical history and problem list. Problem list updated.  Objective:   Vitals:   02/01/17 1100  BP: 101/68  Pulse: 86  Weight: 194 lb (88 kg)    Fetal Status: Fetal Heart Rate (bpm): 137 Fundal Height: 32 cm Movement: Present     General:  Alert, oriented and cooperative. Patient is in no acute distress.  Skin: Skin is warm and dry. No rash noted.   Cardiovascular: Normal heart rate noted  Respiratory: Normal respiratory effort, no problems with respiration noted  Abdomen: Soft, gravid, appropriate for gestational age. Pain/Pressure: Present     Pelvic:  Cervical exam deferred        Extremities: Normal range of motion.     Mental Status: Normal mood and affect. Normal behavior. Normal judgment and thought content.   Assessment and Plan:  Pregnancy: G4P1021 at 1338w6d  1. Supervision of other normal pregnancy, antepartum     Lumbardosis of pregnancy: homeopathic measures given  2. Sickle cell trait (HCC)     Negative UC 12/21/16  3. Low vitamin D level     Slight anemia: OTC iron and colace - Vitamin D, Ergocalciferol, (DRISDOL) 50000 units CAPS capsule; Take 1 capsule (50,000 Units total) by mouth every 7 (seven) days.  Dispense: 30 capsule;  Refill: 2  Preterm labor symptoms and general obstetric precautions including but not limited to vaginal bleeding, contractions, leaking of fluid and fetal movement were reviewed in detail with the patient. Please refer to After Visit Summary for other counseling recommendations.  Return in about 2 weeks (around 02/15/2017) for ROB.   Roe Coombsachelle A Channin Agustin, CNM

## 2017-02-11 ENCOUNTER — Inpatient Hospital Stay (HOSPITAL_COMMUNITY)
Admission: AD | Admit: 2017-02-11 | Discharge: 2017-02-11 | Disposition: A | Discharge status: Home / Self Care | Payer: Medicaid Other | Source: Ambulatory Visit | Attending: Obstetrics & Gynecology | Admitting: Obstetrics & Gynecology

## 2017-02-11 ENCOUNTER — Encounter (HOSPITAL_COMMUNITY): Payer: Self-pay | Admitting: *Deleted

## 2017-02-11 DIAGNOSIS — Z6791 Unspecified blood type, Rh negative: Secondary | ICD-10-CM | POA: Diagnosis not present

## 2017-02-11 DIAGNOSIS — O99013 Anemia complicating pregnancy, third trimester: Secondary | ICD-10-CM | POA: Diagnosis not present

## 2017-02-11 DIAGNOSIS — O1203 Gestational edema, third trimester: Secondary | ICD-10-CM | POA: Diagnosis not present

## 2017-02-11 DIAGNOSIS — Z3A34 34 weeks gestation of pregnancy: Secondary | ICD-10-CM | POA: Diagnosis not present

## 2017-02-11 DIAGNOSIS — Z87891 Personal history of nicotine dependence: Secondary | ICD-10-CM | POA: Diagnosis not present

## 2017-02-11 DIAGNOSIS — D573 Sickle-cell trait: Secondary | ICD-10-CM | POA: Insufficient documentation

## 2017-02-11 DIAGNOSIS — R600 Localized edema: Secondary | ICD-10-CM | POA: Insufficient documentation

## 2017-02-11 NOTE — MAU Note (Signed)
Swollen legs

## 2017-02-11 NOTE — Discharge Instructions (Signed)

## 2017-02-11 NOTE — MAU Provider Note (Signed)
Obstetric Attending MAU Note  Chief Complaint:  Edema in feet   First Provider Initiated Contact with Patient 02/11/17 2134     HPI: Rachel Heath is a 26 y.o. [id number] at [redacted]w[redacted]d who presents to maternity admissions reporting increased bilateral lower extremity edema noted for last few days. Swelling is symmetric bilaterally, associated with mild pain on feet. No calf pain.  No headaches, visual changes, RUQ/epigastric pain. Denies contractions, leakage of fluid or vaginal bleeding. Good fetal movement.   Pregnancy Course: Receives care at [facility] Patient Active Problem List   Diagnosis Date Noted  . Anemia affecting pregnancy in third trimester 02/01/2017  . Sickle cell trait (HCC) 10/25/2016  . Rh negative state in antepartum period 10/25/2016  . Low vitamin D level 10/04/2016  . Supervision of normal pregnancy, antepartum 09/27/2016    Past Medical History:  Diagnosis Date  . Ganglion cyst    right hand  . Heart murmur    per parent  . Sickle cell trait (HCC)     OB History  Gravida Para Term Preterm AB Living  4 1 1   2 1   SAB TAB Ectopic Multiple Live Births  1 1     1     # Outcome Date GA Lbr Len/2nd Weight Sex Delivery Anes PTL Lv  4 Current           3 Term 04/19/14 [redacted]w[redacted]d 09:59 / 02:18 6 lb 8 oz (2.948 kg) M Vag-Spont EPI  LIV  2 TAB           1 SAB               Past Surgical History:  Procedure Laterality Date  . HAND SURGERY      Family History: History reviewed. No pertinent family history.  Social History: Social History  Substance Use Topics  . Smoking status: Former Games developer  . Smokeless tobacco: Never Used  . Alcohol use No    Allergies: No Known Allergies  Prescriptions Prior to Admission  Medication Sig Dispense Refill Last Dose  . butalbital-acetaminophen-caffeine (FIORICET, ESGIC) 50-325-40 MG tablet Take 1-2 tablets by mouth every 6 (six) hours as needed. 45 tablet 4 Taking  . cyclobenzaprine (FLEXERIL) 5 MG tablet Take 1 tablet (5 mg  total) by mouth 3 (three) times daily as needed for muscle spasms. (Patient not taking: Reported on 12/21/2016) 20 tablet 0 Not Taking  . Prenat-FeAsp-Meth-FA-DHA w/o A (PRENATE PIXIE) 10-0.6-0.4-200 MG CAPS Take 1 tablet by mouth daily. 30 capsule 12 Taking  . Vitamin D, Ergocalciferol, (DRISDOL) 50000 units CAPS capsule Take 1 capsule (50,000 Units total) by mouth every 7 (seven) days. 30 capsule 2     ROS: Pertinent findings in history of present illness.  Physical Exam  Blood pressure 116/59, pulse 86, temperature 98.1 F (36.7 C), temperature source Oral, resp. rate 18, height 5\' 5"  (1.651 m), weight 194 lb (88 kg), last menstrual period 06/16/2016. CONSTITUTIONAL: Well-developed, well-nourished female in no acute distress.  HENT:  Normocephalic, atraumatic, External right and left ear normal. Oropharynx is clear and moist EYES: Conjunctivae and EOM are normal. Pupils are equal, round, and reactive to light. No scleral icterus.  NECK: Normal range of motion, supple, no masses SKIN: Skin is warm and dry. No rash noted. Not diaphoretic. No erythema. No pallor. NEUROLGIC: Alert and oriented to person, place, and time. Normal reflexes, muscle tone coordination. No cranial nerve deficit noted. PSYCHIATRIC: Normal mood and affect. Normal behavior. Normal judgment and thought content. CARDIOVASCULAR:  Normal heart rate noted RESPIRATORY: Effort and breath sounds normal, no problems with respiration noted ABDOMEN: Soft, nontender, nondistended, gravid appropriate for gestational age PELVIC: Deferred MUSCULOSKELETAL: Normal range of motion.1+ BLE edema and no tenderness. No calf tenderness.  2+ distal pulses and DTRs.  FHT:  Baseline 140 , moderate variability, accelerations present, no decelerations Contractions: None   Labs: No results found for this or any previous visit (from the past 24 hour(s)).  Imaging:  No results found.  MAU Course: Evaluated legs, no anomalies found. BP stable.  Patient and her mother reassured.   Assessment: 1. Edema during pregnancy in third trimester     Plan: Discharge home Advised to elevated legs, can also wear compression stockings.  Labor precautions and fetal kick counts reviewed Follow up on 02/17/17 as scheduled for prenatal visit.  Follow-up Information    [facility] on [facility]17/2017.   Why:  10:45 am for prenatal appointment as scheduled Contact information: [address] [place] [id number] [phone]          Allergies as of 02/11/2017   No Known Allergies     Medication List    TAKE these medications   butalbital-acetaminophen-caffeine 50-325-40 MG tablet Commonly known as:  FIORICET, ESGIC Take 1-2 tablets by mouth every 6 (six) hours as needed.   cyclobenzaprine 5 MG tablet Commonly known as:  FLEXERIL Take 1 tablet (5 mg total) by mouth 3 (three) times daily as needed for muscle spasms.   PRENATE PIXIE 10-0.6-0.4-200 MG Caps Take 1 tablet by mouth daily.   Vitamin D (Ergocalciferol) 50000 units Caps capsule Commonly known as:  DRISDOL Take 1 capsule (50,000 Units total) by mouth every 7 (seven) days.       Tereso NewcomerUgonna A Remee Charley, MD 02/11/2017 9:42 PM

## 2017-02-17 ENCOUNTER — Ambulatory Visit (INDEPENDENT_AMBULATORY_CARE_PROVIDER_SITE_OTHER): Payer: Medicaid Other | Admitting: Obstetrics and Gynecology

## 2017-02-17 VITALS — BP 113/77 | HR 102 | Wt 196.0 lb

## 2017-02-17 DIAGNOSIS — D573 Sickle-cell trait: Secondary | ICD-10-CM

## 2017-02-17 DIAGNOSIS — O26899 Other specified pregnancy related conditions, unspecified trimester: Secondary | ICD-10-CM

## 2017-02-17 DIAGNOSIS — R7989 Other specified abnormal findings of blood chemistry: Secondary | ICD-10-CM

## 2017-02-17 DIAGNOSIS — Z6791 Unspecified blood type, Rh negative: Secondary | ICD-10-CM

## 2017-02-17 DIAGNOSIS — O99013 Anemia complicating pregnancy, third trimester: Secondary | ICD-10-CM

## 2017-02-17 DIAGNOSIS — Z3480 Encounter for supervision of other normal pregnancy, unspecified trimester: Secondary | ICD-10-CM

## 2017-02-17 MED ORDER — VITAMIN D (ERGOCALCIFEROL) 1.25 MG (50000 UNIT) PO CAPS: 50000.0000 [IU] | ORAL_CAPSULE | ORAL | 2 refills | Status: DC

## 2017-02-17 NOTE — Addendum Note (Signed)
Addended by: Marya LandryFOSTER, Shyleigh Daughtry D on: 02/17/2017 02:44 PM   Modules accepted: Orders

## 2017-02-17 NOTE — Progress Notes (Signed)
Pt states she may be passing mucous plug.

## 2017-02-17 NOTE — Progress Notes (Signed)
Subjective:  Rachel Heath is a 26 y.o. [phone] at 3233w1d being seen today for ongoing prenatal care.  She is currently monitored for the following issues for this low-risk pregnancy and has Supervision of normal pregnancy, antepartum; Low vitamin D level; Sickle cell trait (HCC); Rh negative state in antepartum period; and Anemia affecting pregnancy in third trimester on her problem list.  Patient reports no complaints.  Contractions: Irregular. Vag. Bleeding: None.  Movement: Present. Denies leaking of fluid.   The following portions of the patient's history were reviewed and updated as appropriate: allergies, current medications, past family history, past medical history, past social history, past surgical history and problem list. Problem list updated.  Objective:   Vitals:   02/17/17 1057  BP: 113/77  Pulse: (!) 102  Weight: 196 lb (88.9 kg)    Fetal Status: Fetal Heart Rate (bpm): 140   Movement: Present     General:  Alert, oriented and cooperative. Patient is in no acute distress.  Skin: Skin is warm and dry. No rash noted.   Cardiovascular: Normal heart rate noted  Respiratory: Normal respiratory effort, no problems with respiration noted  Abdomen: Soft, gravid, appropriate for gestational age. Pain/Pressure: Present     Pelvic:  Cervical exam deferred        Extremities: Normal range of motion.     Mental Status: Normal mood and affect. Normal behavior. Normal judgment and thought content.   Urinalysis:      Assessment and Plan:  Pregnancy: G4P1021 at 2533w1d  1. Supervision of other normal pregnancy, antepartum GBS next visit  2. Low vitamin D level  - VITAMIN D 25 Hydroxy (Vit-D Deficiency, Fractures) - Vitamin D, Ergocalciferol, (DRISDOL) 50000 units CAPS capsule; Take 1 capsule (50,000 Units total) by mouth every 7 (seven) days.  Dispense: 30 capsule; Refill: 2  3. Sickle cell trait (HCC) stable  4. Rh negative state in antepartum period S/P Rhogam  5.  Anemia affecting pregnancy in third trimester Continue with PNV  Preterm labor symptoms and general obstetric precautions including but not limited to vaginal bleeding, contractions, leaking of fluid and fetal movement were reviewed in detail with the patient. Please refer to After Visit Summary for other counseling recommendations.  No Follow-up on file.   Hermina StaggersMichael L Zaray Gatchel, MD

## 2017-02-17 NOTE — Patient Instructions (Signed)
Third Trimester of Pregnancy The third trimester is from week 28 through week 40 (months 7 through 9). The third trimester is a time when the unborn baby (fetus) is growing rapidly. At the end of the ninth month, the fetus is about 20 inches in length and weighs 6-10 pounds. Body changes during your third trimester Your body will continue to go through many changes during pregnancy. The changes vary from woman to woman. During the third trimester:  Your weight will continue to increase. You can expect to gain 25-35 pounds (11-16 kg) by the end of the pregnancy.  You may begin to get stretch marks on your hips, abdomen, and breasts.  You may urinate more often because the fetus is moving lower into your pelvis and pressing on your bladder.  You may develop or continue to have heartburn. This is caused by increased hormones that slow down muscles in the digestive tract.  You may develop or continue to have constipation because increased hormones slow digestion and cause the muscles that push waste through your intestines to relax.  You may develop hemorrhoids. These are swollen veins (varicose veins) in the rectum that can itch or be painful.  You may develop swollen, bulging veins (varicose veins) in your legs.  You may have increased body aches in the pelvis, back, or thighs. This is due to weight gain and increased hormones that are relaxing your joints.  You may have changes in your hair. These can include thickening of your hair, rapid growth, and changes in texture. Some women also have hair loss during or after pregnancy, or hair that feels dry or thin. Your hair will most likely return to normal after your baby is born.  Your breasts will continue to grow and they will continue to become tender. A yellow fluid (colostrum) may leak from your breasts. This is the first milk you are producing for your baby.  Your belly button may stick out.  You may notice more swelling in your hands,  face, or ankles.  You may have increased tingling or numbness in your hands, arms, and legs. The skin on your belly may also feel numb.  You may feel short of breath because of your expanding uterus.  You may have more problems sleeping. This can be caused by the size of your belly, increased need to urinate, and an increase in your body's metabolism.  You may notice the fetus "dropping," or moving lower in your abdomen (lightening).  You may have increased vaginal discharge.  You may notice your joints feel loose and you may have pain around your pelvic bone.  What to expect at prenatal visits You will have prenatal exams every 2 weeks until week 36. Then you will have weekly prenatal exams. During a routine prenatal visit:  You will be weighed to make sure you and the baby are growing normally.  Your blood pressure will be taken.  Your abdomen will be measured to track your baby's growth.  The fetal heartbeat will be listened to.  Any test results from the previous visit will be discussed.  You may have a cervical check near your due date to see if your cervix has softened or thinned (effaced).  You will be tested for Group B streptococcus. This happens between 35 and 37 weeks.  Your health care provider may ask you:  What your birth plan is.  How you are feeling.  If you are feeling the baby move.  If you have had   any abnormal symptoms, such as leaking fluid, bleeding, severe headaches, or abdominal cramping.  If you are using any tobacco products, including cigarettes, chewing tobacco, and electronic cigarettes.  If you have any questions.  Other tests or screenings that may be performed during your third trimester include:  Blood tests that check for low iron levels (anemia).  Fetal testing to check the health, activity level, and growth of the fetus. Testing is done if you have certain medical conditions or if there are problems during the  pregnancy.  Nonstress test (NST). This test checks the health of your baby to make sure there are no signs of problems, such as the baby not getting enough oxygen. During this test, a belt is placed around your belly. The baby is made to move, and its heart rate is monitored during movement.  What is false labor? False labor is a condition in which you feel small, irregular tightenings of the muscles in the womb (contractions) that usually go away with rest, changing position, or drinking water. These are called Braxton Hicks contractions. Contractions may last for hours, days, or even weeks before true labor sets in. If contractions come at regular intervals, become more frequent, increase in intensity, or become painful, you should see your health care provider. What are the signs of labor?  Abdominal cramps.  Regular contractions that start at 10 minutes apart and become stronger and more frequent with time.  Contractions that start on the top of the uterus and spread down to the lower abdomen and back.  Increased pelvic pressure and dull back pain.  A watery or bloody mucus discharge that comes from the vagina.  Leaking of amniotic fluid. This is also known as your "water breaking." It could be a slow trickle or a gush. Let your health care provider know if it has a color or strange odor. If you have any of these signs, call your health care provider right away, even if it is before your due date. Follow these instructions at home: Medicines  Follow your health care provider's instructions regarding medicine use. Specific medicines may be either safe or unsafe to take during pregnancy.  Take a prenatal vitamin that contains at least 600 micrograms (mcg) of folic acid.  If you develop constipation, try taking a stool softener if your health care provider approves. Eating and drinking  Eat a balanced diet that includes fresh fruits and vegetables, whole grains, good sources of protein  such as meat, eggs, or tofu, and low-fat dairy. Your health care provider will help you determine the amount of weight gain that is right for you.  Avoid raw meat and uncooked cheese. These carry germs that can cause birth defects in the baby.  If you have low calcium intake from food, talk to your health care provider about whether you should take a daily calcium supplement.  Eat four or five small meals rather than three large meals a day.  Limit foods that are high in fat and processed sugars, such as fried and sweet foods.  To prevent constipation: ? Drink enough fluid to keep your urine clear or pale yellow. ? Eat foods that are high in fiber, such as fresh fruits and vegetables, whole grains, and beans. Activity  Exercise only as directed by your health care provider. Most women can continue their usual exercise routine during pregnancy. Try to exercise for 30 minutes at least 5 days a week. Stop exercising if you experience uterine contractions.  Avoid heavy   lifting.  Do not exercise in extreme heat or humidity, or at high altitudes.  Wear low-heel, comfortable shoes.  Practice good posture.  You may continue to have sex unless your health care provider tells you otherwise. Relieving pain and discomfort  Take frequent breaks and rest with your legs elevated if you have leg cramps or low back pain.  Take warm sitz baths to soothe any pain or discomfort caused by hemorrhoids. Use hemorrhoid cream if your health care provider approves.  Wear a good support bra to prevent discomfort from breast tenderness.  If you develop varicose veins: ? Wear support pantyhose or compression stockings as told by your healthcare provider. ? Elevate your feet for 15 minutes, 3-4 times a day. Prenatal care  Write down your questions. Take them to your prenatal visits.  Keep all your prenatal visits as told by your health care provider. This is important. Safety  Wear your seat belt at  all times when driving.  Make a list of emergency phone numbers, including numbers for family, friends, the hospital, and police and fire departments. General instructions  Avoid cat litter boxes and soil used by cats. These carry germs that can cause birth defects in the baby. If you have a cat, ask someone to clean the litter box for you.  Do not travel far distances unless it is absolutely necessary and only with the approval of your health care provider.  Do not use hot tubs, steam rooms, or saunas.  Do not drink alcohol.  Do not use any products that contain nicotine or tobacco, such as cigarettes and e-cigarettes. If you need help quitting, ask your health care provider.  Do not use any medicinal herbs or unprescribed drugs. These chemicals affect the formation and growth of the baby.  Do not douche or use tampons or scented sanitary pads.  Do not cross your legs for long periods of time.  To prepare for the arrival of your baby: ? Take prenatal classes to understand, practice, and ask questions about labor and delivery. ? Make a trial run to the hospital. ? Visit the hospital and tour the maternity area. ? Arrange for maternity or paternity leave through employers. ? Arrange for family and friends to take care of pets while you are in the hospital. ? Purchase a rear-facing car seat and make sure you know how to install it in your car. ? Pack your hospital bag. ? Prepare the baby's nursery. Make sure to remove all pillows and stuffed animals from the baby's crib to prevent suffocation.  Visit your dentist if you have not gone during your pregnancy. Use a soft toothbrush to brush your teeth and be gentle when you floss. Contact a health care provider if:  You are unsure if you are in labor or if your water has broken.  You become dizzy.  You have mild pelvic cramps, pelvic pressure, or nagging pain in your abdominal area.  You have lower back pain.  You have persistent  nausea, vomiting, or diarrhea.  You have an unusual or bad smelling vaginal discharge.  You have pain when you urinate. Get help right away if:  Your water breaks before 37 weeks.  You have regular contractions less than 5 minutes apart before 37 weeks.  You have a fever.  You are leaking fluid from your vagina.  You have spotting or bleeding from your vagina.  You have severe abdominal pain or cramping.  You have rapid weight loss or weight gain.    You have shortness of breath with chest pain.  You notice sudden or extreme swelling of your face, hands, ankles, feet, or legs.  Your baby makes fewer than 10 movements in 2 hours.  You have severe headaches that do not go away when you take medicine.  You have vision changes. Summary  The third trimester is from week 28 through week 40, months 7 through 9. The third trimester is a time when the unborn baby (fetus) is growing rapidly.  During the third trimester, your discomfort may increase as you and your baby continue to gain weight. You may have abdominal, leg, and back pain, sleeping problems, and an increased need to urinate.  During the third trimester your breasts will keep growing and they will continue to become tender. A yellow fluid (colostrum) may leak from your breasts. This is the first milk you are producing for your baby.  False labor is a condition in which you feel small, irregular tightenings of the muscles in the womb (contractions) that eventually go away. These are called Braxton Hicks contractions. Contractions may last for hours, days, or even weeks before true labor sets in.  Signs of labor can include: abdominal cramps; regular contractions that start at 10 minutes apart and become stronger and more frequent with time; watery or bloody mucus discharge that comes from the vagina; increased pelvic pressure and dull back pain; and leaking of amniotic fluid. This information is not intended to replace advice  given to you by your health care provider. Make sure you discuss any questions you have with your health care provider. Document Released: 11/30/2001 Document Revised: 05/13/2016 Document Reviewed: 02/06/2013 Elsevier Interactive Patient Education  2017 [employer]  

## 2017-02-18 LAB — VITAMIN D 25 HYDROXY (VIT D DEFICIENCY, FRACTURES): Vit D, 25-Hydroxy: 13.1 ng/mL — ABNORMAL LOW (ref 30.0–100.0)

## 2017-02-23 ENCOUNTER — Telehealth: Payer: Self-pay

## 2017-02-23 NOTE — Telephone Encounter (Signed)
Contacted patient and she stated that she is taking her Vitamin D.

## 2017-02-28 ENCOUNTER — Ambulatory Visit (INDEPENDENT_AMBULATORY_CARE_PROVIDER_SITE_OTHER): Payer: Medicaid Other | Admitting: Obstetrics and Gynecology

## 2017-02-28 ENCOUNTER — Other Ambulatory Visit (HOSPITAL_COMMUNITY)
Admission: RE | Admit: 2017-02-28 | Discharge: 2017-02-28 | Disposition: A | Discharge status: Home / Self Care | Payer: Medicaid Other | Source: Ambulatory Visit | Attending: Obstetrics and Gynecology | Admitting: Obstetrics and Gynecology

## 2017-02-28 VITALS — BP 114/76 | HR 87 | Temp 97.3°F | Wt 197.6 lb

## 2017-02-28 DIAGNOSIS — Z6791 Unspecified blood type, Rh negative: Secondary | ICD-10-CM

## 2017-02-28 DIAGNOSIS — O26899 Other specified pregnancy related conditions, unspecified trimester: Principal | ICD-10-CM

## 2017-02-28 DIAGNOSIS — Z113 Encounter for screening for infections with a predominantly sexual mode of transmission: Secondary | ICD-10-CM | POA: Insufficient documentation

## 2017-02-28 DIAGNOSIS — Z3480 Encounter for supervision of other normal pregnancy, unspecified trimester: Secondary | ICD-10-CM

## 2017-02-28 DIAGNOSIS — Z3483 Encounter for supervision of other normal pregnancy, third trimester: Secondary | ICD-10-CM

## 2017-02-28 NOTE — Progress Notes (Signed)
   PRENATAL VISIT NOTE  Subjective:  Rachel Heath is a 26 y.o. [phone] at 3051w5d being seen today for ongoing prenatal care.  She is currently monitored for the following issues for this low-risk pregnancy and has Supervision of normal pregnancy, antepartum; Low vitamin D level; Sickle cell trait (HCC); Rh negative state in antepartum period; and Anemia affecting pregnancy in third trimester on her problem list.  Patient reports no complaints.  Contractions: Irregular. Vag. Bleeding: None.  Movement: Present. Denies leaking of fluid.   The following portions of the patient's history were reviewed and updated as appropriate: allergies, current medications, past family history, past medical history, past social history, past surgical history and problem list. Problem list updated.  Objective:   Vitals:   02/28/17 1117  BP: 114/76  Pulse: 87  Temp: 97.3 F (36.3 C)  Weight: 197 lb 9.6 oz (89.6 kg)    Fetal Status: Fetal Heart Rate (bpm): 148 Fundal Height: 37 cm Movement: Present  Presentation: Vertex  General:  Alert, oriented and cooperative. Patient is in no acute distress.  Skin: Skin is warm and dry. No rash noted.   Cardiovascular: Normal heart rate noted  Respiratory: Normal respiratory effort, no problems with respiration noted  Abdomen: Soft, gravid, appropriate for gestational age. Pain/Pressure: Present     Pelvic:  Cervical exam performed Dilation: Fingertip Effacement (%): Thick Station: Ballotable  Extremities: Normal range of motion.  Edema: Trace  Mental Status: Normal mood and affect. Normal behavior. Normal judgment and thought content.   Assessment and Plan:  Pregnancy: G4P1021 at 551w5d  1. Supervision of other normal pregnancy, antepartum Patient is doing well without complaints Cultures collected - Strep Gp B NAA - GC/Chlamydia probe amp ([place]  2. Rh negative state in antepartum period S/p rhogam  Preterm labor symptoms and general  obstetric precautions including but not limited to vaginal bleeding, contractions, leaking of fluid and fetal movement were reviewed in detail with the patient. Please refer to After Visit Summary for other counseling recommendations.  Return in about 1 week (around 03/07/2017) for ROB.   Catalina AntiguaPeggy Farrin Shadle, MD

## 2017-02-28 NOTE — Progress Notes (Signed)
Patient is in the office, reports good fetal movement. 

## 2017-03-02 LAB — STREP GP B NAA: STREP GROUP B AG: NEGATIVE

## 2017-03-02 LAB — GC/CHLAMYDIA PROBE AMP (~~LOC~~) NOT AT ARMC
CHLAMYDIA, DNA PROBE: NEGATIVE
Neisseria Gonorrhea: NEGATIVE

## 2017-03-07 ENCOUNTER — Ambulatory Visit (INDEPENDENT_AMBULATORY_CARE_PROVIDER_SITE_OTHER): Payer: Medicaid Other | Admitting: Obstetrics and Gynecology

## 2017-03-07 VITALS — BP 111/75 | HR 83 | Wt 196.0 lb

## 2017-03-07 DIAGNOSIS — O99013 Anemia complicating pregnancy, third trimester: Secondary | ICD-10-CM

## 2017-03-07 DIAGNOSIS — Z3480 Encounter for supervision of other normal pregnancy, unspecified trimester: Secondary | ICD-10-CM

## 2017-03-07 DIAGNOSIS — Z3483 Encounter for supervision of other normal pregnancy, third trimester: Secondary | ICD-10-CM

## 2017-03-07 DIAGNOSIS — D649 Anemia, unspecified: Secondary | ICD-10-CM

## 2017-03-07 DIAGNOSIS — Z6791 Unspecified blood type, Rh negative: Secondary | ICD-10-CM

## 2017-03-07 DIAGNOSIS — O26899 Other specified pregnancy related conditions, unspecified trimester: Secondary | ICD-10-CM

## 2017-03-07 NOTE — Progress Notes (Signed)
Pt states she would like cervix check today.

## 2017-03-07 NOTE — Progress Notes (Signed)
   PRENATAL VISIT NOTE  Subjective:  Rachel Heath is a 26 y.o. [phone] at 783w5d being seen today for ongoing prenatal care.  She is currently monitored for the following issues for this low-risk pregnancy and has Supervision of normal pregnancy, antepartum; Low vitamin D level; Sickle cell trait (HCC); Rh negative state in antepartum period; and Anemia affecting pregnancy in third trimester on her problem list.  Patient reports no complaints.  Contractions: Irregular. Vag. Bleeding: None.  Movement: Present. Denies leaking of fluid.   The following portions of the patient's history were reviewed and updated as appropriate: allergies, current medications, past family history, past medical history, past social history, past surgical history and problem list. Problem list updated.  Objective:   Vitals:   03/07/17 0934  BP: 111/75  Pulse: 83  Weight: 196 lb (88.9 kg)    Fetal Status: Fetal Heart Rate (bpm): 130 Fundal Height: 38 cm Movement: Present  Presentation: Vertex  General:  Alert, oriented and cooperative. Patient is in no acute distress.  Skin: Skin is warm and dry. No rash noted.   Cardiovascular: Normal heart rate noted  Respiratory: Normal respiratory effort, no problems with respiration noted  Abdomen: Soft, gravid, appropriate for gestational age. Pain/Pressure: Present     Pelvic:  Cervical exam performed Dilation: 1 Effacement (%): Thick Station: -3  Extremities: Normal range of motion.     Mental Status: Normal mood and affect. Normal behavior. Normal judgment and thought content.   Assessment and Plan:  Pregnancy: G4P1021 at 493w5d  1. Supervision of other normal pregnancy, antepartum Patient is doing well without complaints  2. Rh negative state in antepartum period s/p rhogam   3. Anemia affecting pregnancy in third trimester Continue iron supplementation  Term labor symptoms and general obstetric precautions including but not limited to vaginal bleeding,  contractions, leaking of fluid and fetal movement were reviewed in detail with the patient. Please refer to After Visit Summary for other counseling recommendations.  Return in about 1 week (around 03/14/2017) for ROB.   Catalina AntiguaPeggy Neilah Fulwider, MD

## 2017-03-14 ENCOUNTER — Ambulatory Visit (INDEPENDENT_AMBULATORY_CARE_PROVIDER_SITE_OTHER): Payer: Medicaid Other | Admitting: Obstetrics and Gynecology

## 2017-03-14 VITALS — BP 113/63 | HR 88 | Wt 198.0 lb

## 2017-03-14 DIAGNOSIS — Z6791 Unspecified blood type, Rh negative: Secondary | ICD-10-CM

## 2017-03-14 DIAGNOSIS — D573 Sickle-cell trait: Secondary | ICD-10-CM

## 2017-03-14 DIAGNOSIS — Z3480 Encounter for supervision of other normal pregnancy, unspecified trimester: Secondary | ICD-10-CM

## 2017-03-14 DIAGNOSIS — O99013 Anemia complicating pregnancy, third trimester: Secondary | ICD-10-CM

## 2017-03-14 DIAGNOSIS — O26899 Other specified pregnancy related conditions, unspecified trimester: Principal | ICD-10-CM

## 2017-03-14 DIAGNOSIS — O360930 Maternal care for other rhesus isoimmunization, third trimester, not applicable or unspecified: Secondary | ICD-10-CM

## 2017-03-14 NOTE — Progress Notes (Signed)
Pt would like cervical exam today. 

## 2017-03-14 NOTE — Progress Notes (Signed)
   PRENATAL VISIT NOTE  Subjective:  Rachel Heath is a 26 y.o. [phone] at 1165w5d being seen today for ongoing prenatal care.  She is currently monitored for the following issues for this low-risk pregnancy and has Supervision of normal pregnancy, antepartum; Low vitamin D level; Sickle cell trait (HCC); Rh negative state in antepartum period; and Anemia affecting pregnancy in third trimester on her problem list.  Patient reports no complaints.  Contractions: Irregular. Vag. Bleeding: None.  Movement: Present. Denies leaking of fluid.   The following portions of the patient's history were reviewed and updated as appropriate: allergies, current medications, past family history, past medical history, past social history, past surgical history and problem list. Problem list updated.  Objective:   Vitals:   03/14/17 1408  BP: 113/63  Pulse: 88  Weight: 198 lb (89.8 kg)    Fetal Status: Fetal Heart Rate (bpm): 145 Fundal Height: 38 cm Movement: Present  Presentation: Vertex  General:  Alert, oriented and cooperative. Patient is in no acute distress.  Skin: Skin is warm and dry. No rash noted.   Cardiovascular: Normal heart rate noted  Respiratory: Normal respiratory effort, no problems with respiration noted  Abdomen: Soft, gravid, appropriate for gestational age. Pain/Pressure: Present     Pelvic:  Cervical exam performed Dilation: 1 Effacement (%): Thick Station: -3  Extremities: Normal range of motion.     Mental Status: Normal mood and affect. Normal behavior. Normal judgment and thought content.   Assessment and Plan:  Pregnancy: G4P1021 at 1965w5d  1. Supervision of other normal pregnancy, antepartum Patient is doing well without complaints  2. Rh negative state in antepartum period s/p rhogam  3. Sickle cell trait (HCC) Continue iron supplementation  Term labor symptoms and general obstetric precautions including but not limited to vaginal bleeding, contractions, leaking of  fluid and fetal movement were reviewed in detail with the patient. Please refer to After Visit Summary for other counseling recommendations.  Return in about 1 week (around 03/21/2017).   Catalina AntiguaPeggy Lennix Rotundo, MD

## 2017-03-17 ENCOUNTER — Inpatient Hospital Stay (HOSPITAL_COMMUNITY)
Admission: AD | Admit: 2017-03-17 | Discharge: 2017-03-18 | Disposition: A | Discharge status: Home / Self Care | Payer: Medicaid Other | Source: Ambulatory Visit | Attending: Obstetrics and Gynecology | Admitting: Obstetrics and Gynecology

## 2017-03-17 ENCOUNTER — Encounter (HOSPITAL_COMMUNITY): Payer: Self-pay | Admitting: *Deleted

## 2017-03-17 DIAGNOSIS — O479 False labor, unspecified: Secondary | ICD-10-CM

## 2017-03-17 DIAGNOSIS — Z3480 Encounter for supervision of other normal pregnancy, unspecified trimester: Secondary | ICD-10-CM

## 2017-03-17 NOTE — MAU Note (Signed)
PT  SAYS [facility]    6PM.   NO VE IN OFFICE . DENIES HSV AND  MRSA.  GBS- NEG

## 2017-03-18 ENCOUNTER — Inpatient Hospital Stay (HOSPITAL_COMMUNITY): Payer: Medicaid Other | Admitting: Anesthesiology

## 2017-03-18 ENCOUNTER — Inpatient Hospital Stay (HOSPITAL_COMMUNITY)
Admission: AD | Admit: 2017-03-18 | Discharge: 2017-03-20 | DRG: 775 | Disposition: A | Discharge status: Home / Self Care | Payer: Medicaid Other | Source: Ambulatory Visit | Attending: Obstetrics & Gynecology | Admitting: Obstetrics & Gynecology

## 2017-03-18 ENCOUNTER — Inpatient Hospital Stay (HOSPITAL_COMMUNITY)
Admission: AD | Admit: 2017-03-18 | Discharge: 2017-03-18 | Disposition: A | Discharge status: Home / Self Care | Payer: Medicaid Other | Source: Ambulatory Visit | Attending: Obstetrics and Gynecology | Admitting: Obstetrics and Gynecology

## 2017-03-18 ENCOUNTER — Encounter (HOSPITAL_COMMUNITY): Payer: Self-pay

## 2017-03-18 DIAGNOSIS — K219 Gastro-esophageal reflux disease without esophagitis: Secondary | ICD-10-CM | POA: Diagnosis present

## 2017-03-18 DIAGNOSIS — O26893 Other specified pregnancy related conditions, third trimester: Secondary | ICD-10-CM | POA: Diagnosis present

## 2017-03-18 DIAGNOSIS — Z87891 Personal history of nicotine dependence: Secondary | ICD-10-CM | POA: Diagnosis not present

## 2017-03-18 DIAGNOSIS — O479 False labor, unspecified: Secondary | ICD-10-CM

## 2017-03-18 DIAGNOSIS — Z6834 Body mass index (BMI) 34.0-34.9, adult: Secondary | ICD-10-CM

## 2017-03-18 DIAGNOSIS — O4290 Premature rupture of membranes, unspecified as to length of time between rupture and onset of labor, unspecified weeks of gestation: Secondary | ICD-10-CM | POA: Diagnosis present

## 2017-03-18 DIAGNOSIS — O99214 Obesity complicating childbirth: Secondary | ICD-10-CM | POA: Diagnosis present

## 2017-03-18 DIAGNOSIS — O9962 Diseases of the digestive system complicating childbirth: Secondary | ICD-10-CM | POA: Diagnosis present

## 2017-03-18 DIAGNOSIS — O4292 Full-term premature rupture of membranes, unspecified as to length of time between rupture and onset of labor: Secondary | ICD-10-CM | POA: Diagnosis present

## 2017-03-18 DIAGNOSIS — O360930 Maternal care for other rhesus isoimmunization, third trimester, not applicable or unspecified: Secondary | ICD-10-CM | POA: Diagnosis not present

## 2017-03-18 DIAGNOSIS — Z6791 Unspecified blood type, Rh negative: Secondary | ICD-10-CM | POA: Diagnosis not present

## 2017-03-18 DIAGNOSIS — D573 Sickle-cell trait: Secondary | ICD-10-CM | POA: Diagnosis present

## 2017-03-18 DIAGNOSIS — O9902 Anemia complicating childbirth: Secondary | ICD-10-CM | POA: Diagnosis present

## 2017-03-18 DIAGNOSIS — E669 Obesity, unspecified: Secondary | ICD-10-CM | POA: Diagnosis present

## 2017-03-18 DIAGNOSIS — Z3490 Encounter for supervision of normal pregnancy, unspecified, unspecified trimester: Secondary | ICD-10-CM

## 2017-03-18 DIAGNOSIS — Z3A39 39 weeks gestation of pregnancy: Secondary | ICD-10-CM

## 2017-03-18 DIAGNOSIS — Z3480 Encounter for supervision of other normal pregnancy, unspecified trimester: Secondary | ICD-10-CM

## 2017-03-18 LAB — CBC
HCT: 31 % — ABNORMAL LOW (ref 36.0–46.0)
Hemoglobin: 10.7 g/dL — ABNORMAL LOW (ref 12.0–15.0)
MCH: 27.4 pg (ref 26.0–34.0)
MCHC: 34.5 g/dL (ref 30.0–36.0)
MCV: 79.5 fL (ref 78.0–100.0)
PLATELETS: 279 10*3/uL (ref 150–400)
RBC: 3.9 MIL/uL (ref 3.87–5.11)
RDW: 13.4 % (ref 11.5–15.5)
WBC: 12.4 10*3/uL — ABNORMAL HIGH (ref 4.0–10.5)

## 2017-03-18 LAB — TYPE AND SCREEN
ABO/RH(D): O NEG
ANTIBODY SCREEN: NEGATIVE

## 2017-03-18 MED ORDER — DIBUCAINE 1 % RE OINT: 1.0000 "application " | TOPICAL_OINTMENT | RECTAL | Status: DC | PRN

## 2017-03-18 MED ORDER — LACTATED RINGERS IV SOLN: 500.0000 mL | INTRAVENOUS | Status: DC | PRN

## 2017-03-18 MED ORDER — SOD CITRATE-CITRIC ACID 500-334 MG/5ML PO SOLN: 30.0000 mL | ORAL | Status: DC | PRN

## 2017-03-18 MED ORDER — EPHEDRINE 5 MG/ML INJ
10.0000 mg | INTRAVENOUS | Status: DC | PRN
  Filled 2017-03-18: qty 2

## 2017-03-18 MED ORDER — OXYCODONE-ACETAMINOPHEN 5-325 MG PO TABS
1.0000 | ORAL_TABLET | ORAL | Status: DC | PRN
  Filled 2017-03-18: qty 1

## 2017-03-18 MED ORDER — LACTATED RINGERS IV SOLN: INTRAVENOUS | Status: DC

## 2017-03-18 MED ORDER — OXYTOCIN 40 UNITS IN LACTATED RINGERS INFUSION - SIMPLE MED
2.5000 [IU]/h | INTRAVENOUS | Status: DC
  Filled 2017-03-18: qty 1000

## 2017-03-18 MED ORDER — TERBUTALINE SULFATE 1 MG/ML IJ SOLN
0.2500 mg | Freq: Once | INTRAMUSCULAR | Status: DC | PRN
  Filled 2017-03-18: qty 1

## 2017-03-18 MED ORDER — ONDANSETRON HCL 4 MG/2ML IJ SOLN: 4.0000 mg | INTRAMUSCULAR | Status: DC | PRN

## 2017-03-18 MED ORDER — ONDANSETRON HCL 4 MG/2ML IJ SOLN: 4.0000 mg | Freq: Four times a day (QID) | INTRAMUSCULAR | Status: DC | PRN

## 2017-03-18 MED ORDER — DIPHENHYDRAMINE HCL 25 MG PO CAPS: 25.0000 mg | ORAL_CAPSULE | Freq: Four times a day (QID) | ORAL | Status: DC | PRN

## 2017-03-18 MED ORDER — ZOLPIDEM TARTRATE 5 MG PO TABS: 5.0000 mg | ORAL_TABLET | Freq: Every evening | ORAL | Status: DC | PRN

## 2017-03-18 MED ORDER — FLEET ENEMA 7-19 GM/118ML RE ENEM: 1.0000 | ENEMA | RECTAL | Status: DC | PRN

## 2017-03-18 MED ORDER — FENTANYL 2.5 MCG/ML BUPIVACAINE 1/10 % EPIDURAL INFUSION (WH - ANES)
14.0000 mL/h | INTRAMUSCULAR | Status: DC | PRN
  Administered 2017-03-18: 14 mL/h via EPIDURAL
  Filled 2017-03-18: qty 100

## 2017-03-18 MED ORDER — PHENYLEPHRINE 40 MCG/ML (10ML) SYRINGE FOR IV PUSH (FOR BLOOD PRESSURE SUPPORT)
80.0000 ug | PREFILLED_SYRINGE | INTRAVENOUS | Status: DC | PRN
  Filled 2017-03-18: qty 5
  Filled 2017-03-18: qty 10

## 2017-03-18 MED ORDER — PRENATAL MULTIVITAMIN CH
1.0000 | ORAL_TABLET | Freq: Every day | ORAL | Status: DC
  Administered 2017-03-19 – 2017-03-20 (×2): 1 via ORAL
  Filled 2017-03-18 (×2): qty 1

## 2017-03-18 MED ORDER — PHENYLEPHRINE 40 MCG/ML (10ML) SYRINGE FOR IV PUSH (FOR BLOOD PRESSURE SUPPORT)
80.0000 ug | PREFILLED_SYRINGE | INTRAVENOUS | Status: DC | PRN
  Filled 2017-03-18: qty 5

## 2017-03-18 MED ORDER — ACETAMINOPHEN 325 MG PO TABS
650.0000 mg | ORAL_TABLET | ORAL | Status: DC | PRN
  Administered 2017-03-20: 650 mg via ORAL
  Filled 2017-03-18: qty 2

## 2017-03-18 MED ORDER — SIMETHICONE 80 MG PO CHEW: 80.0000 mg | CHEWABLE_TABLET | ORAL | Status: DC | PRN

## 2017-03-18 MED ORDER — OXYCODONE-ACETAMINOPHEN 5-325 MG PO TABS: 2.0000 | ORAL_TABLET | ORAL | Status: DC | PRN

## 2017-03-18 MED ORDER — ACETAMINOPHEN 325 MG PO TABS: 650.0000 mg | ORAL_TABLET | ORAL | Status: DC | PRN

## 2017-03-18 MED ORDER — COCONUT OIL OIL: 1.0000 "application " | TOPICAL_OIL | Status: DC | PRN

## 2017-03-18 MED ORDER — OXYTOCIN BOLUS FROM INFUSION
500.0000 mL | Freq: Once | INTRAVENOUS | Status: AC
  Administered 2017-03-18: 500 mL via INTRAVENOUS

## 2017-03-18 MED ORDER — LIDOCAINE HCL (PF) 1 % IJ SOLN
30.0000 mL | INTRAMUSCULAR | Status: DC | PRN
  Filled 2017-03-18: qty 30

## 2017-03-18 MED ORDER — BENZOCAINE-MENTHOL 20-0.5 % EX AERO
1.0000 "application " | INHALATION_SPRAY | CUTANEOUS | Status: DC | PRN
  Administered 2017-03-19: 1 via TOPICAL
  Filled 2017-03-18: qty 56

## 2017-03-18 MED ORDER — ONDANSETRON HCL 4 MG PO TABS: 4.0000 mg | ORAL_TABLET | ORAL | Status: DC | PRN

## 2017-03-18 MED ORDER — DIPHENHYDRAMINE HCL 50 MG/ML IJ SOLN: 12.5000 mg | INTRAMUSCULAR | Status: DC | PRN

## 2017-03-18 MED ORDER — IBUPROFEN 600 MG PO TABS
600.0000 mg | ORAL_TABLET | Freq: Four times a day (QID) | ORAL | Status: DC
  Administered 2017-03-18 – 2017-03-20 (×8): 600 mg via ORAL
  Filled 2017-03-18 (×8): qty 1

## 2017-03-18 MED ORDER — WITCH HAZEL-GLYCERIN EX PADS: 1.0000 "application " | MEDICATED_PAD | CUTANEOUS | Status: DC | PRN

## 2017-03-18 MED ORDER — SENNOSIDES-DOCUSATE SODIUM 8.6-50 MG PO TABS
2.0000 | ORAL_TABLET | ORAL | Status: DC
  Administered 2017-03-18 – 2017-03-19 (×2): 2 via ORAL
  Filled 2017-03-18 (×2): qty 2

## 2017-03-18 MED ORDER — TETANUS-DIPHTH-ACELL PERTUSSIS 5-2.5-18.5 LF-MCG/0.5 IM SUSP: 0.5000 mL | Freq: Once | INTRAMUSCULAR | Status: DC

## 2017-03-18 MED ORDER — LACTATED RINGERS IV SOLN: 500.0000 mL | Freq: Once | INTRAVENOUS | Status: DC

## 2017-03-18 MED ORDER — LIDOCAINE HCL (PF) 1 % IJ SOLN
INTRAMUSCULAR | Status: DC | PRN
  Administered 2017-03-18 (×2): 4 mL via EPIDURAL

## 2017-03-18 NOTE — MAU Note (Signed)
Notified K. Kooistra CNM 5 cm SROM, GBS negative, CNM to put in admissions.

## 2017-03-18 NOTE — MAU Note (Signed)
I have communicated with Steward Drone, S-MW and reviewed vital signs:  Vitals:   03/18/17 0519 03/18/17 0644  BP: 122/70 119/65  Pulse: 83 86  Resp: 19 18  Temp: 98.5 F (36.9 C)     Vaginal exam:  Dilation: 2 Effacement (%): 50 Station: -3 Presentation: Vertex Exam by:: Danella Deis, RN,   Also reviewed contraction pattern and that non-stress test is reactive.  It has been documented that patient is contracting every 5-6 minutes with no cervical change over 1 hours not indicating active labor.  Patient denies any other complaints.  Based on this report provider has given order for discharge.  A discharge order and diagnosis entered by a provider.   Labor discharge instructions reviewed with patient.

## 2017-03-18 NOTE — Anesthesia Procedure Notes (Signed)
Epidural Patient location during procedure: OB Start time: 03/18/2017 1:50 PM  Staffing Anesthesiologist: Mal Amabile Performed: anesthesiologist   Preanesthetic Checklist Completed: patient identified, site marked, surgical consent, pre-op evaluation, timeout performed, IV checked, risks and benefits discussed and monitors and equipment checked  Epidural Patient position: sitting Prep: site prepped and draped and DuraPrep Patient monitoring: continuous pulse ox and blood pressure Approach: midline Location: L4-L5 Injection technique: LOR air  Needle:  Needle type: Tuohy  Needle gauge: 17 G Needle length: 9 cm and 9 Needle insertion depth: 8 cm Catheter type: closed end flexible Catheter size: 19 Gauge Catheter at skin depth: 13 cm Test dose: negative and Other  Assessment Events: blood not aspirated, injection not painful, no injection resistance, negative IV test and no paresthesia  Additional Notes Patient identified. Risks and benefits discussed including failed block, incomplete  Pain control, post dural puncture headache, nerve damage, paralysis, blood pressure Changes, nausea, vomiting, reactions to medications-both toxic and allergic and post Partum back pain. All questions were answered. Patient expressed understanding and wished to proceed. Sterile technique was used throughout procedure. Epidural site was Dressed with sterile barrier dressing. No paresthesias, signs of intravascular injection Or signs of intrathecal spread were encountered.  Patient was more comfortable after the epidural was dosed. Please see RN's note for documentation of vital signs and FHR which are stable.

## 2017-03-18 NOTE — MAU Note (Signed)
Pt c/o contractions that have been on-going all night. Pt states the contractions got stronger at 1100. Pt c/o her water breaking at 1215. Pt states baby is moving normally. Pt denies bleeding.

## 2017-03-18 NOTE — Progress Notes (Signed)
This note also relates to the following rows which could not be included: BP - Cannot attach notes to unvalidated device data Pulse Rate - Cannot attach notes to unvalidated device data  Instructed patient to push just enough to alleviate pressure as dr Vear Clock and Natalia Leatherwood had to step out to attend another delivery.

## 2017-03-18 NOTE — MAU Note (Signed)
Patient states contractions got stronger at approximately 0300.  Reports good FM.  No LOF.  Some scant VB.  Pain 9/10.  VE last night was 2/50/-3.

## 2017-03-18 NOTE — Lactation Note (Signed)
This note was copied from a baby's chart. Lactation Consultation Note  Patient Name: Rachel Heath [id number] Date: 03/18/2017 Reason for consult: Initial assessment Baby at 4 hr of life. Upon entry baby was sleeping sts with mom. Mom stated bf has been going well. She denies breast or nipple pain, voiced no concerns. Discussed baby behavior, feeding frequency, baby belly size, voids, wt loss, breast changes, and nipple care. Demonstrated manual expression, colostrum noted bilaterally, spoon at bedside. Given lactation handouts. Aware of OP services and support group.      Maternal Data Formula Feeding for Exclusion: No Has patient been taught Hand Expression?: Yes Does the patient have breastfeeding experience prior to this delivery?: No  Feeding Feeding Type: Breast Fed Length of feed: 15 min  LATCH Score/Interventions                      Lactation Tools Discussed/Used WIC Program: No   Consult Status Consult Status: Follow-up Date: 03/19/17 Follow-up type: In-patient    Rulon Eisenmenger 03/18/2017, 7:06 PM

## 2017-03-18 NOTE — Progress Notes (Signed)
Delivery Note At 3:03 PM a viable female was delivered via Vaginal, Spontaneous Delivery (Presentation: OA to LOA).  APGAR: 8, 9; weight pending  .  Infant placed directly on maternal abdomen; dried and stimulated. Cord cut and clamped after 60 seconds.  Placenta status: delivered intact with gentle traction.  Cord: 3 VC  with the following complications: none .  Cord pH: nA  Anesthesia:  Epidural Episiotomy:  none Lacerations:  1st degree perineal Suture Repair: hemostatic Est. Blood Loss (mL):  200  Mom to postpartum.  Baby to Nursery.  Charlesetta Garibaldi Tannor Pyon CNM 03/18/2017, 3:19 PM

## 2017-03-18 NOTE — MAU Note (Signed)
Contractions since yesterday, water broke around 12:15 today

## 2017-03-18 NOTE — MAU Provider Note (Signed)
Chief Complaint:  Contractions   Rachel Heath is  26 y.o. [phone] at [redacted]w[redacted]d presents complaining of Contractions .  She states regular, every 5 minutes contractions are associated with minimal vaginal bleeding, intact membranes, along with active fetal movement.   Obstetrical/Gynecological History: OB History    Gravida Para Term Preterm AB Living   SAB TAB Ectopic Multiple Live Births   Past Medical History: Past Medical History:  Diagnosis Date  . Ganglion cyst    right hand  . Heart murmur    per parent  . Sickle cell trait [facility])     Past Surgical History: Past Surgical History:  Procedure Laterality Date  . HAND SURGERY      Family History: No family history on file.  Social History: Social History  Substance Use Topics  . Smoking status: Former Games developer  . Smokeless tobacco: Never Used  . Alcohol use No    Allergies: No Known Allergies  Meds:  Prescriptions Prior to Admission  Medication Sig Dispense Refill Last Dose  . Prenat-FeAsp-Meth-FA-DHA w/o A (PRENATE PIXIE) 10-0.6-0.4-200 MG CAPS Take 1 tablet by mouth daily. 30 capsule 12 03/16/2017 at Unknown time  . Vitamin D, Ergocalciferol, (DRISDOL) 50000 units CAPS capsule Take 1 capsule (50,000 Units total) by mouth every 7 (seven) days. 30 capsule 2 Taking    Review of Systems   Constitutional: Negative for fever and chills Eyes: Negative for visual disturbances Respiratory: Negative for shortness of breath, dyspnea Cardiovascular: Negative for chest pain or palpitations  Gastrointestinal: Negative for vomiting, diarrhea and constipation Genitourinary: Negative for dysuria and urgency Musculoskeletal: Negative for back pain, joint pain, myalgias.  Normal ROM  Neurological: Negative for dizziness and headaches   Physical Exam  Blood pressure 122/70, pulse 83, temperature 98.5 F (36.9 C), temperature source Oral, resp. rate 19, last menstrual period 06/16/2016. GENERAL:  Well-developed, well-nourished female in no acute distress.  LUNGS: Clear to auscultation bilaterally.  HEART: Regular rate and rhythm. ABDOMEN: Soft, nontender, nondistended, gravid.  EXTREMITIES: Nontender, no edema, 2+ distal pulses. DTR's 2+ CERVICAL EXAM: Dilation: 2 Effacement (%): 50 Station: -3 Presentation: Vertex Exam by:: Danella Deis, RN Presentation: cephalic FHT:  Baseline rate 130 bpm   Variability moderate  Accelerations present   Decelerations none Contractions: Every 5 mins   Labs: No results found for this or any previous visit (from the past 24 hour(s)). Imaging Studies:  No results found.  Assessment: Rachel Heath is  26 y.o. [phone] at [redacted]w[redacted]d presents with contractions.  Plan: DC home   Steward Drone BSN SNM 3/30/20186:31 AM   I have seen and examined this patient and agree the above assessment.    CRESENZO-DISHMAN,Davyon Fisch 03/18/2017 8:08 AM

## 2017-03-18 NOTE — Anesthesia Preprocedure Evaluation (Signed)
Anesthesia Evaluation  Patient identified by MRN, date of birth, ID band Patient awake    Reviewed: Allergy & Precautions, Patient's Chart, lab work & pertinent test results  Airway Mallampati: II       Dental no notable dental hx. (+) Teeth Intact   Pulmonary former smoker,    Pulmonary exam normal breath sounds clear to auscultation       Cardiovascular negative cardio ROS Normal cardiovascular exam+ Valvular Problems/Murmurs  Rhythm:Regular Rate:Normal     Neuro/Psych negative neurological ROS  negative psych ROS   GI/Hepatic Neg liver ROS, GERD  Medicated and Controlled,  Endo/Other  Obesity  Renal/GU negative Renal ROS  negative genitourinary   Musculoskeletal   Abdominal (+) + obese,   Peds  Hematology  (+) Sickle cell trait and anemia ,   Anesthesia Other Findings   Reproductive/Obstetrics (+) Pregnancy                             Anesthesia Physical Anesthesia Plan  ASA: II  Anesthesia Plan: Epidural   Post-op Pain Management:    Induction:   Airway Management Planned: Natural Airway  Additional Equipment:   Intra-op Plan:   Post-operative Plan:   Informed Consent: I have reviewed the patients History and Physical, chart, labs and discussed the procedure including the risks, benefits and alternatives for the proposed anesthesia with the patient or authorized representative who has indicated his/her understanding and acceptance.     Plan Discussed with: Anesthesiologist  Anesthesia Plan Comments:         Anesthesia Quick Evaluation

## 2017-03-18 NOTE — Discharge Instructions (Signed)
Fetal Movement Counts Patient Name: ________________________________________________ Patient Due Date: ____________________ What is a fetal movement count? A fetal movement count is the number of times that you feel your baby move during a certain amount of time. This may also be called a fetal kick count. A fetal movement count is recommended for every pregnant woman. You may be asked to start counting fetal movements as early as week 28 of your pregnancy. Pay attention to when your baby is most active. You may notice your baby's sleep and wake cycles. You may also notice things that make your baby move more. You should do a fetal movement count:  When your baby is normally most active.  At the same time each day. A good time to count movements is while you are resting, after having something to eat and drink. How do I count fetal movements? 1. Find a quiet, comfortable area. Sit, or lie down on your side. 2. Write down the date, the start time and stop time, and the number of movements that you felt between those two times. Take this information with you to your health care visits. 3. For 2 hours, count kicks, flutters, swishes, rolls, and jabs. You should feel at least 10 movements during 2 hours. 4. You may stop counting after you have felt 10 movements. 5. If you do not feel 10 movements in 2 hours, have something to eat and drink. Then, keep resting and counting for 1 hour. If you feel at least 4 movements during that hour, you may stop counting. Contact a health care provider if:  You feel fewer than 4 movements in 2 hours.  Your baby is not moving like he or she usually does. Date: ____________ Start time: ____________ Stop time: ____________ Movements: ____________ Date: ____________ Start time: ____________ Stop time: ____________ Movements: ____________ Date: ____________ Start time: ____________ Stop time: ____________ Movements: ____________ Date: ____________ Start time:  ____________ Stop time: ____________ Movements: ____________ Date: ____________ Start time: ____________ Stop time: ____________ Movements: ____________ Date: ____________ Start time: ____________ Stop time: ____________ Movements: ____________ Date: ____________ Start time: ____________ Stop time: ____________ Movements: ____________ Date: ____________ Start time: ____________ Stop time: ____________ Movements: ____________ Date: ____________ Start time: ____________ Stop time: ____________ Movements: ____________ This information is not intended to replace advice given to you by your health care provider. Make sure you discuss any questions you have with your health care provider. Document Released: 01/05/2007 Document Revised: 08/04/2016 Document Reviewed: 01/15/2016 Elsevier Interactive Patient Education  2017 [employer] Braxton Hicks Contractions Contractions of the uterus can occur throughout pregnancy, but they are not always a sign that you are in labor. You may have practice contractions called Braxton Hicks contractions. These false labor contractions are sometimes confused with true labor. What are Braxton Hicks contractions? Braxton Hicks contractions are tightening movements that occur in the muscles of the uterus before labor. Unlike true labor contractions, these contractions do not result in opening (dilation) and thinning of the cervix. Toward the end of pregnancy (32-34 weeks), Braxton Hicks contractions can happen more often and may become stronger. These contractions are sometimes difficult to tell apart from true labor because they can be very uncomfortable. You should not feel embarrassed if you go to the hospital with false labor. Sometimes, the only way to tell if you are in true labor is for your health care provider to look for changes in the cervix. The health care provider will do a physical exam and may monitor your contractions. If you   are not in true labor, the exam  should show that your cervix is not dilating and your water has not broken. If there are no prenatal problems or other health problems associated with your pregnancy, it is completely safe for you to be sent home with false labor. You may continue to have Braxton Hicks contractions until you go into true labor. How can I tell the difference between true labor and false labor?  Differences  False labor  Contractions last 30-70 seconds.: Contractions are usually shorter and not as strong as true labor contractions.  Contractions become very regular.: Contractions are usually irregular.  Discomfort is usually felt in the top of the uterus, and it spreads to the lower abdomen and low back.: Contractions are often felt in the front of the lower abdomen and in the groin.  Contractions do not go away with walking.: Contractions may go away when you walk around or change positions while lying down.  Contractions usually become more intense and increase in frequency.: Contractions get weaker and are shorter-lasting as time goes on.  The cervix dilates and gets thinner.: The cervix usually does not dilate or become thin. Follow these instructions at home:  Take over-the-counter and prescription medicines only as told by your health care provider.  Keep up with your usual exercises and follow other instructions from your health care provider.  Eat and drink lightly if you think you are going into labor.  If Braxton Hicks contractions are making you uncomfortable:  Change your position from lying down or resting to walking, or change from walking to resting.  Sit and rest in a tub of warm water.  Drink enough fluid to keep your urine clear or pale yellow. Dehydration may cause these contractions.  Do slow and deep breathing several times an hour.  Keep all follow-up prenatal visits as told by your health care provider. This is important. Contact a health care provider if:  You have a  fever.  You have continuous pain in your abdomen. Get help right away if:  Your contractions become stronger, more regular, and closer together.  You have fluid leaking or gushing from your vagina.  You pass blood-tinged mucus (bloody show).  You have bleeding from your vagina.  You have low back pain that you never had before.  You feel your baby's head pushing down and causing pelvic pressure.  Your baby is not moving inside you as much as it used to. Summary  Contractions that occur before labor are called Braxton Hicks contractions, false labor, or practice contractions.  Braxton Hicks contractions are usually shorter, weaker, farther apart, and less regular than true labor contractions. True labor contractions usually become progressively stronger and regular and they become more frequent.  Manage discomfort from Braxton Hicks contractions by changing position, resting in a warm bath, drinking plenty of water, or practicing deep breathing. This information is not intended to replace advice given to you by your health care provider. Make sure you discuss any questions you have with your health care provider. Document Released: 12/06/2005 Document Revised: 10/25/2016 Document Reviewed: 10/25/2016 Elsevier Interactive Patient Education  2017 [employer]  

## 2017-03-18 NOTE — H&P (Signed)
LABOR AND DELIVERY ADMISSION HISTORY AND PHYSICAL NOTE  Rachel Heath is a 26 y.o. female [phone] with IUP at [redacted]w[redacted]d by LMP c/w 11w [place] presenting for PROM.  She states that she's had contractions since yesterday morning.  They have become more intense since about 9am this morning.  She states that about 1215 today she felt a gush of clear fluid run down her legs.    She reports positive fetal movement. She denies vaginal bleeding.  Prenatal History/Complications:  Past Medical History: Past Medical History:  Diagnosis Date  . Ganglion cyst    right hand  . Heart murmur    per parent  . Sickle cell trait [facility])     Past Surgical History: Past Surgical History:  Procedure Laterality Date  . HAND SURGERY      Obstetrical History: OB History    Gravida Para Term Preterm AB Living   SAB TAB Ectopic Multiple Live Births   Social History: Social History   Social History  . Marital status: Single    Spouse name: N/A  . Number of children: N/A  . Years of education: N/A   Social History Main Topics  . Smoking status: Former Games developer  . Smokeless tobacco: Never Used  . Alcohol use No  . Drug use: No  . Sexual activity: Yes    Birth control/ protection: None     Comment: last depo shot 11/2011   Other Topics Concern  . None   Social History Narrative  . None    Family History: History reviewed. No pertinent family history.  Allergies: No Known Allergies  Prescriptions Prior to Admission  Medication Sig Dispense Refill Last Dose  . Prenat-FeAsp-Meth-FA-DHA w/o A (PRENATE PIXIE) 10-0.6-0.4-200 MG CAPS Take 1 tablet by mouth daily. 30 capsule 12 03/16/2017 at Unknown time  . Vitamin D, Ergocalciferol, (DRISDOL) 50000 units CAPS capsule Take 1 capsule (50,000 Units total) by mouth every 7 (seven) days. 30 capsule 2 Taking     Review of Systems   All systems reviewed and negative except as stated in HPI  Blood pressure (!) 87/48,  pulse (!) 104, resp. rate 18, last menstrual period 06/16/2016, SpO2 97 %. General appearance: alert, cooperative and mild distress Lungs: clear to auscultation bilaterally Heart: regular rate and rhythm Abdomen: soft, non-tender; bowel sounds normal Extremities: No calf swelling or tenderness Presentation: cephalic Fetal monitoring: Cat I strip Uterine activity: q4-5 min Dilation: 5 Effacement (%): 90 Station: -1 Exam by:: cwhite,rnc   Prenatal labs: ABO, Rh: O/Negative/-- (10/09 1113) Antibody: Negative (10/09 1113) Rubella: Immune RPR: Non Reactive (01/02 1044)  HBsAg: Negative (10/09 1113)  HIV: Non Reactive (01/02 1044)  GBS: Negative (03/12 1154)  2 hr Glucola: normal Genetic screening:  Normal Anatomy US: Normal  Prenatal Transfer Tool  Maternal Diabetes: No Genetic Screening: Normal Maternal Ultrasounds/Referrals: None Fetal Ultrasounds or other Referrals:  None Maternal Substance Abuse:  No Significant Maternal Medications:  None Significant Maternal Lab Results: None  No results found for this or any previous visit (from the past 24 hour(s)).  Patient Active Problem List   Diagnosis Date Noted  . Premature rupture of membranes 03/18/2017  . Anemia affecting pregnancy in third trimester 02/01/2017  . Sickle cell trait (HCC) 10/25/2016  . Rh negative state in antepartum period 10/25/2016  . Low vitamin D level 10/04/2016  . Supervision of normal pregnancy,  antepartum 09/27/2016    Assessment: Rachel Heath is a 26 y.o. [id number] at [redacted]w[redacted]d here for PROM at term -Sickle cell trait -Rh negative  #Labor: early active labor. Will monitor and augment as needed #Pain: Desires epidural #FWB: Cat I strip #ID: GBS neg #MOF: breast #MOC:Nexplanon #Circ:  Outpatient  Gwenevere Abbot 03/18/2017, 1:00 PM    I confirm that I have verified the information documented in the resident's note and that I have also personally performed the physical exam and all medical  decision making activities.  Luna Kitchens CNM

## 2017-03-18 NOTE — Discharge Instructions (Signed)

## 2017-03-19 DIAGNOSIS — O9902 Anemia complicating childbirth: Secondary | ICD-10-CM

## 2017-03-19 DIAGNOSIS — Z3A39 39 weeks gestation of pregnancy: Secondary | ICD-10-CM

## 2017-03-19 DIAGNOSIS — O360930 Maternal care for other rhesus isoimmunization, third trimester, not applicable or unspecified: Secondary | ICD-10-CM

## 2017-03-19 LAB — CBC
HCT: 27.7 % — ABNORMAL LOW (ref 36.0–46.0)
HEMOGLOBIN: 9.9 g/dL — AB (ref 12.0–15.0)
MCH: 28.4 pg (ref 26.0–34.0)
MCHC: 35.7 g/dL (ref 30.0–36.0)
MCV: 79.4 fL (ref 78.0–100.0)
PLATELETS: 263 10*3/uL (ref 150–400)
RBC: 3.49 MIL/uL — ABNORMAL LOW (ref 3.87–5.11)
RDW: 13.5 % (ref 11.5–15.5)
WBC: 15 10*3/uL — ABNORMAL HIGH (ref 4.0–10.5)

## 2017-03-19 LAB — RPR: RPR Ser Ql: NONREACTIVE

## 2017-03-19 MED ORDER — RHO D IMMUNE GLOBULIN 1500 UNIT/2ML IJ SOSY
300.0000 ug | PREFILLED_SYRINGE | Freq: Once | INTRAMUSCULAR | Status: AC
  Administered 2017-03-19: 300 ug via INTRAVENOUS
  Filled 2017-03-19: qty 2

## 2017-03-19 NOTE — Progress Notes (Signed)
POSTPARTUM PROGRESS NOTE  Post Partum Day 1 Subjective:  Rachel Heath is a 26 y.o. [id number] [redacted]w[redacted]d s/p SVD.  No acute events overnight.  Pt denies problems with ambulating, voiding or po intake.  She denies nausea or vomiting.  Pain is moderately controlled.  She has had flatus. She has not had bowel movement.  Lochia Small.   Objective: Blood pressure (!) 113/50, pulse 66, temperature 98 F (36.7 C), resp. rate 18, height  (1.626 m), weight 198 lb (89.8 kg), last menstrual period 06/16/2016, SpO2 97 %, unknown if currently breastfeeding.  Physical Exam:  General: alert, cooperative and no distress Lochia:normal flow Chest: no respiratory distress Heart:regular rate, distal pulses intact Abdomen: soft, nontender,  Uterine Fundus: firm, appropriately tender DVT Evaluation: No calf swelling or tenderness Extremities: trace edema   Recent Labs  03/18/17 1310 03/19/17 0507  HGB 10.7* 9.9*  HCT 31.0* 27.7*    Assessment/Plan:  ASSESSMENT: Rachel Heath is a 26 y.o. [id number] [redacted]w[redacted]d s/p SVD  Plan for discharge tomorrow and Breastfeeding and bottle feeding   LOS: 1 day   Les Pou 03/19/2017, 8:40 AM

## 2017-03-19 NOTE — Anesthesia Postprocedure Evaluation (Signed)
Anesthesia Post Note  Patient: Rachel Heath  Procedure(s) Performed: * No procedures listed *  Patient location during evaluation: Mother Baby Anesthesia Type: Epidural Level of consciousness: awake and alert and oriented Pain management: pain level controlled Vital Signs Assessment: post-procedure vital signs reviewed and stable Respiratory status: spontaneous breathing and nonlabored ventilation Cardiovascular status: stable Postop Assessment: no headache, epidural receding, no backache, no signs of nausea or vomiting, patient able to bend at knees and adequate PO intake Anesthetic complications: no        Last Vitals:  Vitals:   03/18/17 2107 03/19/17 0500  BP: 128/72 (!) 113/50  Pulse: 73 66  Resp: 20 18  Temp: 37.3 C 36.7 C    Last Pain:  Vitals:   03/19/17 0618  TempSrc:   PainSc: 3    Pain Goal: Patients Stated Pain Goal: 3 (03/18/17 1252)               Laban Emperor

## 2017-03-20 LAB — RH IG WORKUP (INCLUDES ABO/RH)
ABO/RH(D): O NEG
Fetal Screen: NEGATIVE
GESTATIONAL AGE(WKS): 39.2
Unit division: 0

## 2017-03-20 MED ORDER — IBUPROFEN 600 MG PO TABS: 600.0000 mg | ORAL_TABLET | Freq: Four times a day (QID) | ORAL | 0 refills | Status: DC

## 2017-03-20 NOTE — Lactation Note (Signed)
This note was copied from a baby's chart. Lactation Consultation Note  Patient Name: Rachel Heath [id number] Date: 03/20/2017 Reason for consult: Follow-up assessment;Infant weight loss Baby is 21 hours old and has been getting bottles per mom due to a right nipple being sore,  LC assessed with moms permission - bruising noted on the upper edge of the nipple , easily hand expressed milk and had mom apply it to the nipple. Left nipple clear no breakdown or sore ness. Per mom I think I really want to breast feed and if that doesn't work to pump .  Per mom has a DEBP at home. Sore nipple and engorgement prevention reviewed. LC instructed mom on the use comfort gels , shells, hand pump,  Baby awake and hungry. LC offered to help latch on the left breast , and mom receptive.  Baby latched easily in the football position, multiple swallows. Baby fed 15 mins and mom was comfortable with the entire feeding. Nipple well rounded when the baby came off. And baby was satisfied. Mom seemed very excited the baby was able to latch back on.  LC recommended to mom to avoid the pacifier and if the baby still hungry to re-latch or supplement 20 -30 ml until the right nipple is clear and the milk in in.  Mother informed of post-discharge support and given phone number to the lactation department, including services for phone call assistance; out-patient appointments; and breastfeeding support group. List of other breastfeeding resources in the community given in the handout. Encouraged mother to call for problems or concerns related to breastfeeding.   Maternal Data Has patient been taught Hand Expression?: Yes  Feeding Feeding Type: Breast Fed Nipple Type: Slow - flow Length of feed:  (multiple swallows noted )  LATCH Score/Interventions Latch: Grasps breast easily, tongue down, lips flanged, rhythmical sucking.  Audible Swallowing: Spontaneous and intermittent  Type of Nipple: Everted at rest and after  stimulation  Comfort (Breast/Nipple): Soft / non-tender     Hold (Positioning): Assistance needed to correctly position infant at breast and maintain latch. Intervention(s): Breastfeeding basics reviewed;Support Pillows;Position options;Skin to skin  LATCH Score: 9  Lactation Tools Discussed/Used Tools: Pump;Shells;Comfort gels Shell Type: Other (comment);Inverted Breast pump type: Manual (and a DEBP ) Pump Review: Setup, frequency, and cleaning;Milk Storage Initiated by:: MAI  Date initiated:: 03/20/17   Consult Status Consult Status: Complete Date: 03/20/17    Matilde Sprang Mylik Pro 03/20/2017, 10:14 AM

## 2017-03-20 NOTE — Discharge Instructions (Signed)

## 2017-03-20 NOTE — Progress Notes (Signed)
Pt wishes to speak with birth Passenger transport manager. Reached out to birth registrar twice prior to discharge this morning with no response. Pt given birth registrar phone number and told to call 1st thing tomorrow morning.

## 2017-03-20 NOTE — Discharge Summary (Signed)
OB Discharge Summary     Patient Name: Rachel Heath DOB: 06/12/1991 MRN: [id number]  Date of admission: 03/18/2017 Delivering MD: Marylene Land   Date of discharge: 03/20/2017  Admitting diagnosis: 39WKS CTX Intrauterine pregnancy: [redacted]w[redacted]d     Secondary diagnosis:  Principal Problem:   Premature rupture of membranes Active Problems:   Supervision of normal pregnancy, antepartum   Full-term premature rupture of membranes   Vaginal delivery  Additional problems: none     Discharge diagnosis: Term Pregnancy Delivered                                                                                                Post partum procedures:none  Augmentation: none  Complications: None  Hospital course:  Onset of Labor With Vaginal Delivery     26 y.o. yo [id number] at [redacted]w[redacted]d was admitted in Active Labor on 03/18/2017. Patient had an uncomplicated labor course as follows:  Membrane Rupture Time/Date: 12:15 PM ,03/18/2017   Intrapartum Procedures: Episiotomy: None [1]                                         Lacerations:  None [1]  Patient had a delivery of a Viable infant. 03/18/2017  Information for the patient's newborn:  Milta, Croson [[id number]]  Delivery Method: Vaginal, Spontaneous Delivery (Filed from Delivery Summary)    Pateint had an uncomplicated postpartum course.  She is ambulating, tolerating a regular diet, passing flatus, and urinating well. Patient is discharged home in stable condition on 03/20/17.   Physical exam  Vitals:   03/18/17 2107 03/19/17 0500 03/19/17 1735 03/20/17 0458  BP: 128/72 (!) 113/50 110/61 (!) 114/59  Pulse: 73 66 74 67  Resp: Temp: 99.1 F (37.3 C) 98 F (36.7 C) 98.4 F (36.9 C) 97.8 F (36.6 C)  TempSrc:   Oral Oral  SpO2:      Weight:      Height:       General: alert, cooperative and no distress Lochia: appropriate Uterine Fundus: firm Incision: Healing well with no significant drainage DVT  Evaluation: No evidence of DVT seen on physical exam. Labs: Lab Results  Component Value Date   WBC 15.0 (H) 03/19/2017   HGB 9.9 (L) 03/19/2017   HCT 27.7 (L) 03/19/2017   MCV 79.4 03/19/2017   PLT 263 03/19/2017   CMP Latest Ref Rng & Units 07/04/2014  Glucose 70 - 99 mg/dL 130(Q)  BUN 6 - 23 mg/dL 8  Creatinine 6.57 - 8.46 mg/dL 9.62  Sodium 952 - 841 mEq/L 146  Potassium 3.7 - 5.3 mEq/L 3.4(L)  Chloride 96 - 112 mEq/L 106  CO2 19 - 32 mEq/L 25  Calcium 8.4 - 10.5 mg/dL 8.7  Total Protein 6.0 - 8.3 g/dL 7.1  Total Bilirubin 0.3 - 1.2 mg/dL 0.4  Alkaline Phos 39 - 117 U/L 74  AST 0 - 37 U/L 22  ALT 0 - 35 U/L 11    Discharge  instruction: per After Visit Summary and "Baby and Me Booklet".  After visit meds:  Allergies as of 03/20/2017   No Known Allergies     Medication List    TAKE these medications   diphenhydrAMINE 25 mg capsule Commonly known as:  BENADRYL Take 25 mg by mouth at bedtime as needed for sleep.   ibuprofen 600 MG tablet Commonly known as:  ADVIL,MOTRIN Take 1 tablet (600 mg total) by mouth every 6 (six) hours.   PRENATE PIXIE 10-0.6-0.4-200 MG Caps Take 1 tablet by mouth daily.   Vitamin D (Ergocalciferol) 50000 units Caps capsule Commonly known as:  DRISDOL Take 1 capsule (50,000 Units total) by mouth every 7 (seven) days.       Diet: routine diet  Activity: Advance as tolerated. Pelvic rest for 6 weeks.   Outpatient follow up:6 weeks Follow up Appt:Future Appointments Date Time Provider Department Center  03/22/2017 11:15 AM [place] Bing, MD [facility] None   Follow up Visit:No Follow-up on file.  Postpartum contraception: Undecided  Newborn Data: Live born female  Birth Weight: 7 lb 9 oz (3430 g) APGAR: 8, 9  Baby Feeding: Breast Disposition:home with mother   03/20/2017 Wynelle Bourgeois, CNM

## 2017-03-22 ENCOUNTER — Encounter: Payer: Medicaid Other | Admitting: Obstetrics and Gynecology

## 2017-05-25 ENCOUNTER — Ambulatory Visit (INDEPENDENT_AMBULATORY_CARE_PROVIDER_SITE_OTHER): Payer: Medicaid Other | Admitting: Obstetrics and Gynecology

## 2017-05-25 DIAGNOSIS — Z309 Encounter for contraceptive management, unspecified: Secondary | ICD-10-CM | POA: Insufficient documentation

## 2017-05-25 DIAGNOSIS — Z30011 Encounter for initial prescription of contraceptive pills: Secondary | ICD-10-CM

## 2017-05-25 MED ORDER — NORGESTIMATE-ETH ESTRADIOL 0.25-35 MG-MCG PO TABS: 1.0000 | ORAL_TABLET | Freq: Every day | ORAL | 11 refills | Status: DC

## 2017-05-25 NOTE — Progress Notes (Signed)
Post Partum Exam  Rachel Heath is a 26 y.o. [id number] female who presents for a postpartum visit. She is 9 weeks postpartum following a spontaneous vaginal delivery. I have fully reviewed the prenatal and intrapartum course. The delivery was at 39 gestational weeks.  Anesthesia: epidural. Postpartum course has been doing well. Baby's course has been doing well. Baby is feeding by bottle - Enfamil Nutramigen. Bleeding pt states she is currently on cycle that started today.Bowel function is normal. Bladder function is normal. Patient is sexually active. Last intercourse this past weekend. Contraception method is condoms. Pt states that she would like to start on birth control pill. Postpartum depression screening:neg, score 0.  The following portions of the patient's history were reviewed and updated as appropriate: allergies, current medications, past family history, past medical history, past social history and past surgical history.  Review of Systems Pertinent items are noted in HPI.    Objective:  Last menstrual period 05/25/2017, unknown if currently breastfeeding.  General:  alert   Breasts:  deferred  Lungs: clear to auscultation bilaterally  Heart:  regular rate and rhythm, S1, S2 normal, no murmur, click, rub or gallop  Abdomen: soft, non-tender; bowel sounds normal; no masses,  no organomegaly   Vulva:  not evaluated  Vagina: not evaluated  Cervix:  not evaluated  Corpus: not examined  Adnexa:  not evaluated  Rectal Exam: Not performed.        Assessment:    Normal postpartum exam.  Contraceptive management  Plan:   1. Contraception: OCP (estrogen/progesterone) 2. Return to normal activities 3. Follow up in: Oct for yearly exam

## 2017-05-25 NOTE — Patient Instructions (Addendum)
Oral Contraception Information Oral contraceptive pills (OCPs) are medicines taken to prevent pregnancy. OCPs work by preventing the ovaries from releasing eggs. The hormones in OCPs also cause the cervical mucus to thicken, preventing the sperm from entering the uterus. The hormones also cause the uterine lining to become thin, not allowing a fertilized egg to attach to the inside of the uterus. OCPs are highly effective when taken exactly as prescribed. However, OCPs do not prevent sexually transmitted diseases (STDs). Safe sex practices, such as using condoms along with the pill, can help prevent STDs. Before taking the pill, you may have a physical exam and Pap test. Your health care provider may order blood tests. The health care provider will make sure you are a good candidate for oral contraception. Discuss with your health care provider the possible side effects of the OCP you may be prescribed. When starting an OCP, it can take 2 to 3 months for the body to adjust to the changes in hormone levels in your body. Types of oral contraception  The combination pill-This pill contains estrogen and progestin (synthetic progesterone) hormones. The combination pill comes in 21-day, 28-day, or 91-day packs. Some types of combination pills are meant to be taken continuously (365-day pills). With 21-day packs, you do not take pills for 7 days after the last pill. With 28-day packs, the pill is taken every day. The last 7 pills are without hormones. Certain types of pills have more than 21 hormone-containing pills. With 91-day packs, the first 84 pills contain both hormones, and the last 7 pills contain no hormones or contain estrogen only.  The minipill-This pill contains the progesterone hormone only. The pill is taken every day continuously. It is very important to take the pill at the same time each day. The minipill comes in packs of 28 pills. All 28 pills contain the hormone. Advantages of oral  contraceptive pills  Decreases premenstrual symptoms.  Treats menstrual period cramps.  Regulates the menstrual cycle.  Decreases a heavy menstrual flow.  May treatacne, depending on the type of pill.  Treats abnormal uterine bleeding.  Treats polycystic ovarian syndrome.  Treats endometriosis.  Can be used as emergency contraception. Things that can make oral contraceptive pills less effective OCPs can be less effective if:  You forget to take the pill at the same time every day.  You have a stomach or intestinal disease that lessens the absorption of the pill.  You take OCPs with other medicines that make OCPs less effective, such as antibiotics, certain HIV medicines, and some seizure medicines.  You take expired OCPs.  You forget to restart the pill on day 7, when using the packs of 21 pills.  Risks associated with oral contraceptive pills Oral contraceptive pills can sometimes cause side effects, such as:  Headache.  Nausea.  Breast tenderness.  Irregular bleeding or spotting.  Combination pills are also associated with a small increased risk of:  Blood clots.  Heart attack.  Stroke.  This information is not intended to replace advice given to you by your health care provider. Make sure you discuss any questions you have with your health care provider. Document Released: 02/26/2003 Document Revised: 05/13/2016 Document Reviewed: 05/27/2013 Elsevier Interactive Patient Education  2018 [place] Maintenance, Female Adopting a healthy lifestyle and getting preventive care can go a long way to promote health and wellness. Talk with your health care provider about what schedule of regular examinations is right for you. This is a good chance  for you to check in with your provider about disease prevention and staying healthy. In between checkups, there are plenty of things you can do on your own. Experts have done a lot of research about which  lifestyle changes and preventive measures are most likely to keep you healthy. Ask your health care provider for more information. Weight and diet Eat a healthy diet  Be sure to include plenty of vegetables, fruits, low-fat dairy products, and lean protein.  Do not eat a lot of foods high in solid fats, added sugars, or salt.  Get regular exercise. This is one of the most important things you can do for your health. ? Most adults should exercise for at least 150 minutes each week. The exercise should increase your heart rate and make you sweat (moderate-intensity exercise). ? Most adults should also do strengthening exercises at least twice a week. This is in addition to the moderate-intensity exercise.  Maintain a healthy weight  Body mass index (BMI) is a measurement that can be used to identify possible weight problems. It estimates body fat based on height and weight. Your health care provider can help determine your BMI and help you achieve or maintain a healthy weight.  For females 12 years of age and older: ? A BMI below 18.5 is considered underweight. ? A BMI of 18.5 to 24.9 is normal. ? A BMI of 25 to 29.9 is considered overweight. ? A BMI of 30 and above is considered obese.  Watch levels of cholesterol and blood lipids  You should start having your blood tested for lipids and cholesterol at 24 years of age, then have this test every 5 years.  You may need to have your cholesterol levels checked more often if: ? Your lipid or cholesterol levels are high. ? You are older than 24 years of age. ? You are at high risk for heart disease.  Cancer screening Lung Cancer  Lung cancer screening is recommended for adults 73-78 years old who are at high risk for lung cancer because of a history of smoking.  A yearly low-dose CT scan of the lungs is recommended for people who: ? Currently smoke. ? Have quit within the past 15 years. ? Have at least a 30-pack-year history of  smoking. A pack year is smoking an average of one pack of cigarettes a day for 1 year.  Yearly screening should continue until it has been 15 years since you quit.  Yearly screening should stop if you develop a health problem that would prevent you from having lung cancer treatment.  Breast Cancer  Practice breast self-awareness. This means understanding how your breasts normally appear and feel.  It also means doing regular breast self-exams. Let your health care provider know about any changes, no matter how small.  If you are in your 20s or 30s, you should have a clinical breast exam (CBE) by a health care provider every 1-3 years as part of a regular health exam.  If you are 75 or older, have a CBE every year. Also consider having a breast X-ray (mammogram) every year.  If you have a family history of breast cancer, talk to your health care provider about genetic screening.  If you are at high risk for breast cancer, talk to your health care provider about having an MRI and a mammogram every year.  Breast cancer gene (BRCA) assessment is recommended for women who have family members with BRCA-related cancers. BRCA-related cancers include: ? Breast. ?  Ovarian. ? Tubal. ? Peritoneal cancers.  Results of the assessment will determine the need for genetic counseling and BRCA1 and BRCA2 testing.  Cervical Cancer Your health care provider may recommend that you be screened regularly for cancer of the pelvic organs (ovaries, uterus, and vagina). This screening involves a pelvic examination, including checking for microscopic changes to the surface of your cervix (Pap test). You may be encouraged to have this screening done every 3 years, beginning at age 33.  For women ages 98-65, health care providers may recommend pelvic exams and Pap testing every 3 years, or they may recommend the Pap and pelvic exam, combined with testing for human papilloma virus (HPV), every 5 years. Some types of  HPV increase your risk of cervical cancer. Testing for HPV may also be done on women of any age with unclear Pap test results.  Other health care providers may not recommend any screening for nonpregnant women who are considered low risk for pelvic cancer and who do not have symptoms. Ask your health care provider if a screening pelvic exam is right for you.  If you have had past treatment for cervical cancer or a condition that could lead to cancer, you need Pap tests and screening for cancer for at least 20 years after your treatment. If Pap tests have been discontinued, your risk factors (such as having a new sexual partner) need to be reassessed to determine if screening should resume. Some women have medical problems that increase the chance of getting cervical cancer. In these cases, your health care provider may recommend more frequent screening and Pap tests.  Colorectal Cancer  This type of cancer can be detected and often prevented.  Routine colorectal cancer screening usually begins at 24 years of age and continues through 24 years of age.  Your health care provider may recommend screening at an earlier age if you have risk factors for colon cancer.  Your health care provider may also recommend using home test kits to check for hidden blood in the stool.  A small camera at the end of a tube can be used to examine your colon directly (sigmoidoscopy or colonoscopy). This is done to check for the earliest forms of colorectal cancer.  Routine screening usually begins at age 20.  Direct examination of the colon should be repeated every 5-10 years through 24 years of age. However, you may need to be screened more often if early forms of precancerous polyps or small growths are found.  Skin Cancer  Check your skin from head to toe regularly.  Tell your health care provider about any new moles or changes in moles, especially if there is a change in a mole's shape or color.  Also tell  your health care provider if you have a mole that is larger than the size of a pencil eraser.  Always use sunscreen. Apply sunscreen liberally and repeatedly throughout the day.  Protect yourself by wearing long sleeves, pants, a wide-brimmed hat, and sunglasses whenever you are outside.  Heart disease, diabetes, and high blood pressure  High blood pressure causes heart disease and increases the risk of stroke. High blood pressure is more likely to develop in: ? People who have blood pressure in the high end of the normal range (130-139/85-89 mm Hg). ? People who are overweight or obese. ? People who are African American.  If you are 63-15 years of age, have your blood pressure checked every 3-5 years. If you are 40 years of  age or older, have your blood pressure checked every year. You should have your blood pressure measured twice-once when you are at a hospital or clinic, and once when you are not at a hospital or clinic. Record the average of the two measurements. To check your blood pressure when you are not at a hospital or clinic, you can use: ? An automated blood pressure machine at a pharmacy. ? A home blood pressure monitor.  If you are between 66 years and 1 years old, ask your health care provider if you should take aspirin to prevent strokes.  Have regular diabetes screenings. This involves taking a blood sample to check your fasting blood sugar level. ? If you are at a normal weight and have a low risk for diabetes, have this test once every three years after 24 years of age. ? If you are overweight and have a high risk for diabetes, consider being tested at a younger age or more often. Preventing infection Hepatitis B  If you have a higher risk for hepatitis B, you should be screened for this virus. You are considered at high risk for hepatitis B if: ? You were born in a country where hepatitis B is common. Ask your health care provider which countries are considered high  risk. ? Your parents were born in a high-risk country, and you have not been immunized against hepatitis B (hepatitis B vaccine). ? You have HIV or AIDS. ? You use needles to inject street drugs. ? You live with someone who has hepatitis B. ? You have had sex with someone who has hepatitis B. ? You get hemodialysis treatment. ? You take certain medicines for conditions, including cancer, organ transplantation, and autoimmune conditions.  Hepatitis C  Blood testing is recommended for: ? Everyone born from [birth year] through 1963. ? Anyone with known risk factors for hepatitis C.  Sexually transmitted infections (STIs)  You should be screened for sexually transmitted infections (STIs) including gonorrhea and chlamydia if: ? You are sexually active and are younger than 24 years of age. ? You are older than 24 years of age and your health care provider tells you that you are at risk for this type of infection. ? Your sexual activity has changed since you were last screened and you are at an increased risk for chlamydia or gonorrhea. Ask your health care provider if you are at risk.  If you do not have HIV, but are at risk, it may be recommended that you take a prescription medicine daily to prevent HIV infection. This is called pre-exposure prophylaxis (PrEP). You are considered at risk if: ? You are sexually active and do not regularly use condoms or know the HIV status of your partner(s). ? You take drugs by injection. ? You are sexually active with a partner who has HIV.  Talk with your health care provider about whether you are at high risk of being infected with HIV. If you choose to begin PrEP, you should first be tested for HIV. You should then be tested every 3 months for as long as you are taking PrEP. Pregnancy  If you are premenopausal and you may become pregnant, ask your health care provider about preconception counseling.  If you may become pregnant, take 400 to 800 micrograms  (mcg) of folic acid every day.  If you want to prevent pregnancy, talk to your health care provider about birth control (contraception). Osteoporosis and menopause  Osteoporosis is a disease in which the bones  lose minerals and strength with aging. This can result in serious bone fractures. Your risk for osteoporosis can be identified using a bone density scan.  If you are 80 years of age or older, or if you are at risk for osteoporosis and fractures, ask your health care provider if you should be screened.  Ask your health care provider whether you should take a calcium or vitamin D supplement to lower your risk for osteoporosis.  Menopause may have certain physical symptoms and risks.  Hormone replacement therapy may reduce some of these symptoms and risks. Talk to your health care provider about whether hormone replacement therapy is right for you. Follow these instructions at home:  Schedule regular health, dental, and eye exams.  Stay current with your immunizations.  Do not use any tobacco products including cigarettes, chewing tobacco, or electronic cigarettes.  If you are pregnant, do not drink alcohol.  If you are breastfeeding, limit how much and how often you drink alcohol.  Limit alcohol intake to no more than 1 drink per day for nonpregnant women. One drink equals 12 ounces of beer, 5 ounces of wine, or 1 ounces of hard liquor.  Do not use street drugs.  Do not share needles.  Ask your health care provider for help if you need support or information about quitting drugs.  Tell your health care provider if you often feel depressed.  Tell your health care provider if you have ever been abused or do not feel safe at home. This information is not intended to replace advice given to you by your health care provider. Make sure you discuss any questions you have with your health care provider. Document Released: 06/21/2011 Document Revised: 05/13/2016 Document Reviewed:  09/09/2015 Elsevier Interactive Patient Education  [employer].

## 2017-12-20 NOTE — L&D Delivery Note (Signed)
Patient arrived in active labor. She progressed quickly to C/C with BBOW. AROM for clear fluid, and then rapid NSVD after AROM. Large gush of blood and clots after the baby, and prior to the placenta.   Delivery Note At 10:04 PM a viable female was delivered via Vaginal, Spontaneous (Presentation:LOA ).  APGAR: 9, 9; weight  .   Placenta status:spontaneous, complete/intact .  Cord: 3 vessels with the following complications: none .  Cord pH: NA   Anesthesia:  Epidural  Episiotomy: None Lacerations: None Suture Repair: NA  Est. Blood Loss (mL): 557 In and out cath placed for approx 300cc of clear yellow urine. Pitocin bolusing, and fundal massage performed. Patient given 800mcg cytotec buccally. Bleeding normalized at that point.    Mom to postpartum.  Baby to Couplet care / Skin to Skin.  Thressa ShellerHeather Anyjah Roundtree 11/04/2018, 10:39 PM

## 2018-05-17 ENCOUNTER — Other Ambulatory Visit (HOSPITAL_COMMUNITY): Payer: Self-pay | Admitting: Nurse Practitioner

## 2018-05-17 DIAGNOSIS — Z369 Encounter for antenatal screening, unspecified: Secondary | ICD-10-CM

## 2018-05-18 ENCOUNTER — Encounter (HOSPITAL_COMMUNITY): Payer: Self-pay | Admitting: *Deleted

## 2018-05-19 ENCOUNTER — Other Ambulatory Visit (HOSPITAL_COMMUNITY): Payer: Self-pay | Admitting: *Deleted

## 2018-05-19 ENCOUNTER — Encounter (HOSPITAL_COMMUNITY): Payer: Self-pay

## 2018-05-19 ENCOUNTER — Ambulatory Visit (HOSPITAL_COMMUNITY)
Admission: RE | Admit: 2018-05-19 | Discharge: 2018-05-19 | Disposition: A | Discharge status: Home / Self Care | Payer: Self-pay | Source: Ambulatory Visit | Attending: Nurse Practitioner | Admitting: Nurse Practitioner

## 2018-05-19 ENCOUNTER — Other Ambulatory Visit (HOSPITAL_COMMUNITY): Payer: Self-pay | Admitting: Nurse Practitioner

## 2018-05-19 DIAGNOSIS — Z832 Family history of diseases of the blood and blood-forming organs and certain disorders involving the immune mechanism: Secondary | ICD-10-CM

## 2018-05-19 DIAGNOSIS — D573 Sickle-cell trait: Secondary | ICD-10-CM

## 2018-05-19 DIAGNOSIS — Z3A13 13 weeks gestation of pregnancy: Secondary | ICD-10-CM

## 2018-05-19 DIAGNOSIS — O99411 Diseases of the circulatory system complicating pregnancy, first trimester: Secondary | ICD-10-CM | POA: Insufficient documentation

## 2018-05-19 DIAGNOSIS — R011 Cardiac murmur, unspecified: Secondary | ICD-10-CM

## 2018-05-19 DIAGNOSIS — Z369 Encounter for antenatal screening, unspecified: Secondary | ICD-10-CM

## 2018-05-19 DIAGNOSIS — O99011 Anemia complicating pregnancy, first trimester: Secondary | ICD-10-CM | POA: Insufficient documentation

## 2018-05-19 DIAGNOSIS — Z362 Encounter for other antenatal screening follow-up: Secondary | ICD-10-CM

## 2018-05-22 LAB — OB RESULTS CONSOLE RPR: RPR: NONREACTIVE

## 2018-05-22 LAB — OB RESULTS CONSOLE HEPATITIS B SURFACE ANTIGEN: Hepatitis B Surface Ag: NEGATIVE

## 2018-05-22 LAB — OB RESULTS CONSOLE GC/CHLAMYDIA
Chlamydia: NEGATIVE
GC PROBE AMP, GENITAL: NEGATIVE

## 2018-05-22 LAB — OB RESULTS CONSOLE HIV ANTIBODY (ROUTINE TESTING): HIV: NONREACTIVE

## 2018-05-22 LAB — OB RESULTS CONSOLE RUBELLA ANTIBODY, IGM: Rubella: NON-IMMUNE/NOT IMMUNE

## 2018-06-23 ENCOUNTER — Other Ambulatory Visit (HOSPITAL_COMMUNITY): Payer: Self-pay | Admitting: *Deleted

## 2018-06-23 ENCOUNTER — Other Ambulatory Visit (HOSPITAL_COMMUNITY): Payer: Self-pay | Admitting: Obstetrics and Gynecology

## 2018-06-23 ENCOUNTER — Ambulatory Visit (HOSPITAL_COMMUNITY)
Admission: RE | Admit: 2018-06-23 | Discharge: 2018-06-23 | Disposition: A | Discharge status: Home / Self Care | Payer: Medicaid Other | Source: Ambulatory Visit | Attending: Nurse Practitioner | Admitting: Nurse Practitioner

## 2018-06-23 DIAGNOSIS — Z3A18 18 weeks gestation of pregnancy: Secondary | ICD-10-CM

## 2018-06-23 DIAGNOSIS — Z362 Encounter for other antenatal screening follow-up: Secondary | ICD-10-CM | POA: Diagnosis present

## 2018-06-23 DIAGNOSIS — Z862 Personal history of diseases of the blood and blood-forming organs and certain disorders involving the immune mechanism: Secondary | ICD-10-CM

## 2018-06-23 DIAGNOSIS — O2692 Pregnancy related conditions, unspecified, second trimester: Secondary | ICD-10-CM

## 2018-06-23 DIAGNOSIS — Z363 Encounter for antenatal screening for malformations: Secondary | ICD-10-CM

## 2018-07-21 ENCOUNTER — Other Ambulatory Visit (HOSPITAL_COMMUNITY): Payer: Self-pay | Admitting: Maternal and Fetal Medicine

## 2018-07-21 ENCOUNTER — Ambulatory Visit (HOSPITAL_COMMUNITY)
Admission: RE | Admit: 2018-07-21 | Discharge: 2018-07-21 | Disposition: A | Discharge status: Home / Self Care | Payer: Medicaid Other | Source: Ambulatory Visit | Attending: Nurse Practitioner | Admitting: Nurse Practitioner

## 2018-07-21 DIAGNOSIS — Z3A22 22 weeks gestation of pregnancy: Secondary | ICD-10-CM | POA: Diagnosis not present

## 2018-07-21 DIAGNOSIS — Z362 Encounter for other antenatal screening follow-up: Secondary | ICD-10-CM

## 2018-07-21 DIAGNOSIS — Z862 Personal history of diseases of the blood and blood-forming organs and certain disorders involving the immune mechanism: Secondary | ICD-10-CM

## 2018-07-21 DIAGNOSIS — O2692 Pregnancy related conditions, unspecified, second trimester: Secondary | ICD-10-CM | POA: Diagnosis not present

## 2018-11-04 ENCOUNTER — Inpatient Hospital Stay (HOSPITAL_COMMUNITY): Payer: Medicaid Other | Admitting: Anesthesiology

## 2018-11-04 ENCOUNTER — Inpatient Hospital Stay (HOSPITAL_COMMUNITY)
Admission: AD | Admit: 2018-11-04 | Discharge: 2018-11-06 | DRG: 806 | Disposition: A | Discharge status: Home / Self Care | Payer: Medicaid Other | Attending: Obstetrics & Gynecology | Admitting: Obstetrics & Gynecology

## 2018-11-04 ENCOUNTER — Encounter (HOSPITAL_COMMUNITY): Payer: Self-pay | Admitting: *Deleted

## 2018-11-04 DIAGNOSIS — Z3483 Encounter for supervision of other normal pregnancy, third trimester: Secondary | ICD-10-CM | POA: Diagnosis present

## 2018-11-04 DIAGNOSIS — O26893 Other specified pregnancy related conditions, third trimester: Secondary | ICD-10-CM | POA: Diagnosis present

## 2018-11-04 DIAGNOSIS — Z3A37 37 weeks gestation of pregnancy: Secondary | ICD-10-CM | POA: Diagnosis not present

## 2018-11-04 DIAGNOSIS — Z6791 Unspecified blood type, Rh negative: Secondary | ICD-10-CM | POA: Diagnosis not present

## 2018-11-04 DIAGNOSIS — Z87891 Personal history of nicotine dependence: Secondary | ICD-10-CM

## 2018-11-04 DIAGNOSIS — O9902 Anemia complicating childbirth: Principal | ICD-10-CM | POA: Diagnosis present

## 2018-11-04 DIAGNOSIS — R011 Cardiac murmur, unspecified: Secondary | ICD-10-CM | POA: Diagnosis present

## 2018-11-04 DIAGNOSIS — D573 Sickle-cell trait: Secondary | ICD-10-CM | POA: Diagnosis present

## 2018-11-04 LAB — CBC
HCT: 32.8 % — ABNORMAL LOW (ref 36.0–46.0)
Hemoglobin: 11.3 g/dL — ABNORMAL LOW (ref 12.0–15.0)
MCH: 29 pg (ref 26.0–34.0)
MCHC: 34.5 g/dL (ref 30.0–36.0)
MCV: 84.3 fL (ref 80.0–100.0)
NRBC: 0 % (ref 0.0–0.2)
PLATELETS: 288 10*3/uL (ref 150–400)
RBC: 3.89 MIL/uL (ref 3.87–5.11)
RDW: 12.9 % (ref 11.5–15.5)
WBC: 12.9 10*3/uL — ABNORMAL HIGH (ref 4.0–10.5)

## 2018-11-04 MED ORDER — OXYTOCIN 40 UNITS IN LACTATED RINGERS INFUSION - SIMPLE MED
INTRAVENOUS | Status: AC
  Administered 2018-11-05: 04:00:00
  Filled 2018-11-04: qty 1000

## 2018-11-04 MED ORDER — LACTATED RINGERS IV SOLN
INTRAVENOUS | Status: DC
  Administered 2018-11-04: 21:00:00 via INTRAVENOUS

## 2018-11-04 MED ORDER — SOD CITRATE-CITRIC ACID 500-334 MG/5ML PO SOLN: 30.0000 mL | ORAL | Status: DC | PRN

## 2018-11-04 MED ORDER — OXYTOCIN BOLUS FROM INFUSION
500.0000 mL | Freq: Once | INTRAVENOUS | Status: AC
  Administered 2018-11-04: 500 mL via INTRAVENOUS

## 2018-11-04 MED ORDER — ACETAMINOPHEN 325 MG PO TABS
650.0000 mg | ORAL_TABLET | ORAL | Status: DC | PRN
  Administered 2018-11-05: 650 mg via ORAL
  Filled 2018-11-04: qty 2

## 2018-11-04 MED ORDER — PHENYLEPHRINE 40 MCG/ML (10ML) SYRINGE FOR IV PUSH (FOR BLOOD PRESSURE SUPPORT)
80.0000 ug | PREFILLED_SYRINGE | INTRAVENOUS | Status: DC | PRN
  Filled 2018-11-04: qty 5

## 2018-11-04 MED ORDER — DIPHENHYDRAMINE HCL 50 MG/ML IJ SOLN
12.5000 mg | INTRAMUSCULAR | Status: DC | PRN
  Administered 2018-11-04 (×2): 12.5 mg via INTRAVENOUS
  Filled 2018-11-04: qty 1

## 2018-11-04 MED ORDER — TRANEXAMIC ACID-NACL 1000-0.7 MG/100ML-% IV SOLN
INTRAVENOUS | Status: AC
  Filled 2018-11-04: qty 100

## 2018-11-04 MED ORDER — MISOPROSTOL 200 MCG PO TABS
ORAL_TABLET | ORAL | Status: AC
  Administered 2018-11-04: 800 ug via BUCCAL
  Filled 2018-11-04: qty 4

## 2018-11-04 MED ORDER — FENTANYL 2.5 MCG/ML BUPIVACAINE 1/10 % EPIDURAL INFUSION (WH - ANES)
14.0000 mL/h | INTRAMUSCULAR | Status: DC | PRN
  Administered 2018-11-04: 14 mL/h via EPIDURAL

## 2018-11-04 MED ORDER — LIDOCAINE HCL (PF) 1 % IJ SOLN
INTRAMUSCULAR | Status: AC
  Filled 2018-11-04: qty 30

## 2018-11-04 MED ORDER — TRANEXAMIC ACID-NACL 1000-0.7 MG/100ML-% IV SOLN
INTRAVENOUS | Status: AC
  Administered 2018-11-04: 1000 mg
  Filled 2018-11-04: qty 100

## 2018-11-04 MED ORDER — OXYTOCIN 40 UNITS IN LACTATED RINGERS INFUSION - SIMPLE MED: 2.5000 [IU]/h | INTRAVENOUS | Status: DC

## 2018-11-04 MED ORDER — LACTATED RINGERS IV SOLN
500.0000 mL | INTRAVENOUS | Status: DC | PRN
  Administered 2018-11-05: 1000 mL via INTRAVENOUS

## 2018-11-04 MED ORDER — LIDOCAINE HCL (PF) 1 % IJ SOLN
30.0000 mL | INTRAMUSCULAR | Status: DC | PRN
  Filled 2018-11-04: qty 30

## 2018-11-04 MED ORDER — FENTANYL 2.5 MCG/ML BUPIVACAINE 1/10 % EPIDURAL INFUSION (WH - ANES)
INTRAMUSCULAR | Status: AC
  Filled 2018-11-04: qty 100

## 2018-11-04 MED ORDER — EPHEDRINE 5 MG/ML INJ
10.0000 mg | INTRAVENOUS | Status: DC | PRN
  Filled 2018-11-04: qty 2

## 2018-11-04 MED ORDER — LACTATED RINGERS IV SOLN
500.0000 mL | Freq: Once | INTRAVENOUS | Status: AC
  Administered 2018-11-04: 500 mL via INTRAVENOUS

## 2018-11-04 MED ORDER — TRANEXAMIC ACID-NACL 1000-0.7 MG/100ML-% IV SOLN: 1000.0000 mg | Freq: Once | INTRAVENOUS | Status: DC

## 2018-11-04 MED ORDER — FENTANYL CITRATE (PF) 100 MCG/2ML IJ SOLN
100.0000 ug | INTRAMUSCULAR | Status: DC | PRN
  Administered 2018-11-05: 100 ug via INTRAVENOUS
  Filled 2018-11-04: qty 2

## 2018-11-04 MED ORDER — LIDOCAINE HCL (PF) 1 % IJ SOLN
INTRAMUSCULAR | Status: DC | PRN
  Administered 2018-11-04: 8 mL via EPIDURAL

## 2018-11-04 MED ORDER — PHENYLEPHRINE 40 MCG/ML (10ML) SYRINGE FOR IV PUSH (FOR BLOOD PRESSURE SUPPORT)
PREFILLED_SYRINGE | INTRAVENOUS | Status: AC
  Filled 2018-11-04: qty 10

## 2018-11-04 MED ORDER — BUPIVACAINE HCL (PF) 0.25 % IJ SOLN
INTRAMUSCULAR | Status: DC | PRN
  Administered 2018-11-04: 10 mL via EPIDURAL

## 2018-11-04 MED ORDER — ONDANSETRON HCL 4 MG/2ML IJ SOLN: 4.0000 mg | Freq: Four times a day (QID) | INTRAMUSCULAR | Status: DC | PRN

## 2018-11-04 MED ORDER — DIPHENHYDRAMINE HCL 50 MG/ML IJ SOLN: 12.5000 mg | INTRAMUSCULAR | Status: DC | PRN

## 2018-11-04 NOTE — Anesthesia Preprocedure Evaluation (Signed)
Anesthesia Evaluation  Patient identified by MRN, date of birth, ID band Patient awake    Reviewed: Allergy & Precautions, H&P , NPO status , Patient's Chart, lab work & pertinent test results, reviewed documented beta blocker date and time   Airway Mallampati: II  TM Distance: >3 FB Neck ROM: full    Dental no notable dental hx.    Pulmonary neg pulmonary ROS, former smoker,    Pulmonary exam normal breath sounds clear to auscultation       Cardiovascular negative cardio ROS Normal cardiovascular exam Rhythm:regular Rate:Normal     Neuro/Psych negative neurological ROS  negative psych ROS   GI/Hepatic negative GI ROS, Neg liver ROS,   Endo/Other  negative endocrine ROS  Renal/GU negative Renal ROS  negative genitourinary   Musculoskeletal   Abdominal   Peds  Hematology negative hematology ROS (+)   Anesthesia Other Findings   Reproductive/Obstetrics (+) Pregnancy                             Anesthesia Physical Anesthesia Plan  ASA: II  Anesthesia Plan: Epidural   Post-op Pain Management:    Induction:   PONV Risk Score and Plan:   Airway Management Planned:   Additional Equipment:   Intra-op Plan:   Post-operative Plan:   Informed Consent: I have reviewed the patients History and Physical, chart, labs and discussed the procedure including the risks, benefits and alternatives for the proposed anesthesia with the patient or authorized representative who has indicated his/her understanding and acceptance.   Dental Advisory Given  Plan Discussed with: CRNA, Anesthesiologist and Surgeon  Anesthesia Plan Comments: (Labs checked- platelets confirmed with RN in room. Fetal heart tracing, per RN, reported to be stable enough for sitting procedure. Discussed epidural, and patient consents to the procedure:  included risk of possible headache,backache, failed block, allergic  reaction, and nerve injury. This patient was asked if she had any questions or concerns before the procedure started.)        Anesthesia Quick Evaluation

## 2018-11-04 NOTE — Anesthesia Procedure Notes (Signed)
Epidural Patient location during procedure: OB Start time: 11/04/2018 9:50 PM End time: 11/04/2018 9:55 PM  Staffing Anesthesiologist: Bethena Midgetddono, Pegi Milazzo, MD  Preanesthetic Checklist Completed: patient identified, site marked, surgical consent, pre-op evaluation, timeout performed, IV checked, risks and benefits discussed and monitors and equipment checked  Epidural Patient position: sitting Prep: site prepped and draped and DuraPrep Patient monitoring: continuous pulse ox and blood pressure Approach: midline Location: L3-L4 Injection technique: LOR air  Needle:  Needle type: Tuohy  Needle gauge: 17 G Needle length: 9 cm and 9 Needle insertion depth: 6 cm Catheter type: closed end flexible Catheter size: 19 Gauge Catheter at skin depth: 11 cm Test dose: negative  Assessment Events: blood not aspirated, injection not painful, no injection resistance, negative IV test and no paresthesia

## 2018-11-04 NOTE — H&P (Signed)
LABOR AND DELIVERY ADMISSION HISTORY AND PHYSICAL NOTE  Rachel Heath is a 27 y.o. female [phone] with IUP at [redacted]w[redacted]d by LMP presenting for SOL.  She began feeling contractions intermittently since 11/03/18. Pt noticed contractions feeling stronger earlier today prior to presentation. Noticed bloody show, but no leaking fluid. She reports positive fetal movement.   Pregnancy has been uncomplicated. Only medication was prenatal vitamin. She has a noted heart murmur. Pt is unsure what type of murmur, but states that it is monitored by a cardiologist annually. No other cardiac history. Per pt's chart, she had a TTE in 2014 that was a normal and supported a physiologic murmur of pregnancy. Prior two deliveries were both vaginal and uncomplicated pregnancies.  Prenatal History/Complications: PNC at HD Pregnancy complications:  - Uncomplicated pregnancy - Pt has HbS trait. Per pt, FOB has been tested and is not HbS trait carrier - Known heart murmur. Pt notes she is followed by a cardiologist annually but otherwise has no cardiac-related issues  Past Medical History: Past Medical History:  Diagnosis Date  . Ganglion cyst    right hand  . Heart murmur    per parent  . Sickle cell trait [facility])     Past Surgical History: Past Surgical History:  Procedure Laterality Date  . HAND SURGERY      Obstetrical History: OB History    Gravida  5   Para  2   Term  2   Preterm      AB  2   Living  2     SAB  1   TAB  1   Ectopic      Multiple  0   Live Births  2           Social History: Social History   Socioeconomic History  . Marital status: Single    Spouse name: Not on file  . Number of children: Not on file  . Years of education: Not on file  . Highest education level: Not on file  Occupational History  . Not on file  Social Needs  . Financial resource strain: Not on file  . Food insecurity:    Worry: Not on file    Inability: Not on file  . Transportation  needs:    Medical: Not on file    Non-medical: Not on file  Tobacco Use  . Smoking status: Former Smoker    Types: Cigars    Last attempt to quit: 05/19/2016    Years since quitting: 2.4  . Smokeless tobacco: Never Used  Substance and Sexual Activity  . Alcohol use: No  . Drug use: No    Frequency: 3.0 times per week  . Sexual activity: Yes    Birth control/protection: None    Comment: last depo shot 11/2011  Lifestyle  . Physical activity:    Days per week: Not on file    Minutes per session: Not on file  . Stress: Not on file  Relationships  . Social connections:    Talks on phone: Not on file    Gets together: Not on file    Attends religious service: Not on file    Active member of club or organization: Not on file    Attends meetings of clubs or organizations: Not on file    Relationship status: Not on file  Other Topics Concern  . Not on file  Social History Narrative  . Not on file    Family History: No  family history on file.  Allergies: No Known Allergies  Medications Prior to Admission  Medication Sig Dispense Refill Last Dose  . norgestimate-ethinyl estradiol (ORTHO-CYCLEN,SPRINTEC,PREVIFEM) 0.25-35 MG-MCG tablet Take 1 tablet by mouth daily. (Patient not taking: Reported on 05/19/2018) 1 Package 11 Not Taking  . Prenatal Vit w/Fe-Methylfol-FA (PNV PO) Take by mouth.   Taking     Review of Systems  All systems reviewed and negative except as stated in HPI  Physical Exam Blood pressure 109/63, pulse 79, temperature 98.7 F (37.1 C), temperature source Oral, resp. rate 18, height 5\' 4"  (1.626 m), weight 88.9 kg, last menstrual period 02/16/2018, SpO2 99 %, unknown if currently breastfeeding. General appearance: alert, oriented, NAD Lungs: normal respiratory effort Heart: regular rate Abdomen: soft, non-tender; gravid, FH appropriate for GA Extremities: No calf swelling or tenderness Presentation: cephalic Fetal monitoring: Baseline FHR 150-160 bpm  with moderate variability, +accelerations, -decelerations Uterine activity: Uterine contractions every 2-3 minutes Dilation: 7.5 Effacement (%): 80 Station: -1 Exam by:: K.Wilson,RN  Prenatal labs: ABO, Rh: --/--/O NEG (11/16 2010) Antibody: PENDING (11/16 2010) Rubella: Nonimmune (06/03 0000) RPR: Nonreactive (06/03 0000)  HBsAg: Negative (06/03 0000)  HIV: Non-reactive (06/03 0000)  GC/Chlamydia: Negative GBS:  Negative 2-hr GTT: Normal Genetic screening: Quad Screen Negative Anatomy US: Normal  Prenatal Transfer Tool  Maternal Diabetes: No Genetic Screening: Normal Mom has HbS trait and, per pt, FOB has been tested and does not carry trait Maternal Ultrasounds/Referrals: Normal Fetal Ultrasounds or other Referrals:  None Maternal Substance Abuse:  No Significant Maternal Medications:  None Significant Maternal Lab Results: None  Results for orders placed or performed during the hospital encounter of 11/04/18 (from the past 24 hour(s))  CBC   Collection Time: 11/04/18  8:10 PM  Result Value Ref Range   WBC 12.9 (H) 4.0 - 10.5 K/uL   RBC 3.89 3.87 - 5.11 MIL/uL   Hemoglobin 11.3 (L) 12.0 - 15.0 g/dL   HCT 09.8 (L) 11.9 - 14.7 %   MCV 84.3 80.0 - 100.0 fL   MCH 29.0 26.0 - 34.0 pg   MCHC 34.5 30.0 - 36.0 g/dL   RDW 82.9 56.2 - 13.0 %   Platelets 288 150 - 400 K/uL   nRBC 0.0 0.0 - 0.2 %  Type and screen [facility]   Collection Time: 11/04/18  8:10 PM  Result Value Ref Range   ABO/RH(D) O NEG    Antibody Screen PENDING    Sample Expiration      11/07/2018 Performed at [facility], [address], [place], [place] [address]     Patient Active Problem List   Diagnosis Date Noted  . Normal labor 11/04/2018  . First trimester screening   . [redacted] weeks gestation of pregnancy   . Heart murmur   . Family history of sickle cell trait   . Postpartum care and examination 05/25/2017  . Contraception management 05/25/2017  . Sickle cell trait  (HCC) 10/25/2016  . Low vitamin D level 10/04/2016    Assessment: Rachel Heath is a 27 y.o. [phone] at [redacted]w[redacted]d here for SOL  #Labor: -- Uterine contractions every 2-3 minutes -- Pain: Epidural placed  #FWB:  -- Baseline FHR 150-160 bpm with moderate variability, +accelerations, -decelerations -- EFW 53%ile on [redacted] week GA [place]  #ID:  GBS (-) #MOF: Breast #MOC: IUD  Hal Neer 11/04/2018, 9:22 PM   I confirm that I have verified the information documented in the medical student's note and that I  have also personally reperformed the physical exam and all medical decision making activities.   Thressa ShellerHeather Hogan 12:39 AM 11/05/18

## 2018-11-04 NOTE — Anesthesia Postprocedure Evaluation (Signed)
Anesthesia Post Note  Patient: Rachel Heath  Procedure(s) Performed: AN AD HOC LABOR EPIDURAL     Patient location during evaluation: Mother Baby Anesthesia Type: Epidural Level of consciousness: awake and alert Pain management: pain level controlled Vital Signs Assessment: post-procedure vital signs reviewed and stable Respiratory status: spontaneous breathing, nonlabored ventilation and respiratory function stable Cardiovascular status: stable Postop Assessment: no headache, no backache and epidural receding Anesthetic complications: no    Last Vitals:  Vitals:   11/04/18 2233 11/04/18 2249  BP: (!) 103/51 (!) 134/98  Pulse: (!) 128 (!) 129  Resp:  (!) 22  Temp:  37.6 C  SpO2:      Last Pain:  Vitals:   11/04/18 2249  TempSrc: Oral  PainSc:    Pain Goal: Patients Stated Pain Goal: 2 (11/04/18 2126)               Mckinley Olheiser

## 2018-11-04 NOTE — Anesthesia Pain Management Evaluation Note (Signed)
  CRNA Pain Management Visit Note  Patient: Rachel Heath, 27 y.o., female  "Hello I am a member of the anesthesia team at [facility]. We have an anesthesia team available at all times to provide care throughout the hospital, including epidural management and anesthesia for C-section. I don't know your plan for the delivery whether it a natural birth, water birth, IV sedation, nitrous supplementation, doula or epidural, but we want to meet your pain goals."   1.Was your pain managed to your expectations on prior hospitalizations?   Yes   2.What is your expectation for pain management during this hospitalization?     Epidural  3.How can we help you reach that goal? Epidural dosed for contraction pain unrelieved by boluses by RN  Record the patient's initial score and the patient's pain goal.   Pain: 2, pressure  Pain Goal:4 [facility] wants you to be able to say your pain was always managed very well.  Nazaire Cordial 11/04/2018

## 2018-11-04 NOTE — MAU Note (Signed)
Pt reports ctx on and off since yesterday stronger the last few hours. C/o bloody show no leaking. Good fetal movmement felt.

## 2018-11-05 ENCOUNTER — Encounter (HOSPITAL_COMMUNITY): Payer: Self-pay | Admitting: *Deleted

## 2018-11-05 LAB — CBC
HCT: 31.4 % — ABNORMAL LOW (ref 36.0–46.0)
HEMATOCRIT: 28.2 % — AB (ref 36.0–46.0)
HEMOGLOBIN: 9.7 g/dL — AB (ref 12.0–15.0)
Hemoglobin: 10.7 g/dL — ABNORMAL LOW (ref 12.0–15.0)
MCH: 28.5 pg (ref 26.0–34.0)
MCH: 28.8 pg (ref 26.0–34.0)
MCHC: 34.1 g/dL (ref 30.0–36.0)
MCHC: 34.4 g/dL (ref 30.0–36.0)
MCV: 83.7 fL (ref 80.0–100.0)
MCV: 83.7 fL (ref 80.0–100.0)
Platelets: 271 10*3/uL (ref 150–400)
Platelets: 236 10*3/uL (ref 150–400)
RBC: 3.75 MIL/uL — ABNORMAL LOW (ref 3.87–5.11)
RBC: 3.37 MIL/uL — AB (ref 3.87–5.11)
RDW: 12.7 % (ref 11.5–15.5)
RDW: 12.7 % (ref 11.5–15.5)
WBC: 16.1 10*3/uL — ABNORMAL HIGH (ref 4.0–10.5)
WBC: 15.9 10*3/uL — ABNORMAL HIGH (ref 4.0–10.5)
nRBC: 0 % (ref 0.0–0.2)
nRBC: 0 % (ref 0.0–0.2)

## 2018-11-05 LAB — APTT: aPTT: 31 seconds (ref 24–36)

## 2018-11-05 LAB — PROTIME-INR
INR: 1.06
PROTHROMBIN TIME: 13.7 s (ref 11.4–15.2)

## 2018-11-05 LAB — FIBRINOGEN: Fibrinogen: 397 mg/dL (ref 210–475)

## 2018-11-05 LAB — SAVE SMEAR(SSMR), FOR PROVIDER SLIDE REVIEW

## 2018-11-05 LAB — RPR: RPR Ser Ql: NONREACTIVE

## 2018-11-05 MED ORDER — PRENATAL MULTIVITAMIN CH
1.0000 | ORAL_TABLET | Freq: Every day | ORAL | Status: DC
  Administered 2018-11-05 – 2018-11-06 (×2): 1 via ORAL
  Filled 2018-11-05 (×2): qty 1

## 2018-11-05 MED ORDER — METHYLERGONOVINE MALEATE 0.2 MG/ML IJ SOLN: 0.2000 mg | INTRAMUSCULAR | Status: DC | PRN

## 2018-11-05 MED ORDER — LACTATED RINGERS IV BOLUS: 1000.0000 mL | Freq: Once | INTRAVENOUS | Status: DC

## 2018-11-05 MED ORDER — METHYLERGONOVINE MALEATE 0.2 MG PO TABS: 0.2000 mg | ORAL_TABLET | ORAL | Status: DC | PRN

## 2018-11-05 MED ORDER — ONDANSETRON HCL 4 MG/2ML IJ SOLN: 4.0000 mg | INTRAMUSCULAR | Status: DC | PRN

## 2018-11-05 MED ORDER — OXYTOCIN 40 UNITS IN LACTATED RINGERS INFUSION - SIMPLE MED
5.0000 [IU]/h | INTRAVENOUS | Status: DC
  Administered 2018-11-05: 5 [IU]/h via INTRAVENOUS
  Filled 2018-11-05: qty 1000

## 2018-11-05 MED ORDER — METHYLERGONOVINE MALEATE 0.2 MG/ML IJ SOLN
INTRAMUSCULAR | Status: AC
  Administered 2018-11-05: 0.2 mg
  Filled 2018-11-05: qty 1

## 2018-11-05 MED ORDER — METHYLERGONOVINE MALEATE 0.2 MG/ML IJ SOLN: 0.2000 mg | INTRAMUSCULAR | Status: AC

## 2018-11-05 MED ORDER — METHYLERGONOVINE MALEATE 0.2 MG PO TABS
0.2000 mg | ORAL_TABLET | ORAL | Status: AC
  Administered 2018-11-05 (×6): 0.2 mg via ORAL
  Filled 2018-11-05 (×6): qty 1

## 2018-11-05 MED ORDER — MISOPROSTOL 200 MCG PO TABS
800.0000 ug | ORAL_TABLET | Freq: Once | ORAL | Status: AC
  Administered 2018-11-04: 800 ug via BUCCAL

## 2018-11-05 MED ORDER — ONDANSETRON HCL 4 MG PO TABS: 4.0000 mg | ORAL_TABLET | ORAL | Status: DC | PRN

## 2018-11-05 MED ORDER — DIBUCAINE 1 % RE OINT: 1.0000 "application " | TOPICAL_OINTMENT | RECTAL | Status: DC | PRN

## 2018-11-05 MED ORDER — DIPHENHYDRAMINE HCL 25 MG PO CAPS: 25.0000 mg | ORAL_CAPSULE | Freq: Four times a day (QID) | ORAL | Status: DC | PRN

## 2018-11-05 MED ORDER — OXYCODONE-ACETAMINOPHEN 5-325 MG PO TABS: 2.0000 | ORAL_TABLET | ORAL | Status: DC | PRN

## 2018-11-05 MED ORDER — SODIUM CHLORIDE 0.9 % IV SOLN
3.0000 g | Freq: Once | INTRAVENOUS | Status: AC
  Administered 2018-11-05: 3 g via INTRAVENOUS
  Filled 2018-11-05: qty 3

## 2018-11-05 MED ORDER — SIMETHICONE 80 MG PO CHEW: 80.0000 mg | CHEWABLE_TABLET | ORAL | Status: DC | PRN

## 2018-11-05 MED ORDER — ACETAMINOPHEN 325 MG PO TABS: 650.0000 mg | ORAL_TABLET | ORAL | Status: DC | PRN

## 2018-11-05 MED ORDER — TETANUS-DIPHTH-ACELL PERTUSSIS 5-2.5-18.5 LF-MCG/0.5 IM SUSP: 0.5000 mL | Freq: Once | INTRAMUSCULAR | Status: DC

## 2018-11-05 MED ORDER — BENZOCAINE-MENTHOL 20-0.5 % EX AERO
1.0000 "application " | INHALATION_SPRAY | CUTANEOUS | Status: DC | PRN
  Filled 2018-11-05: qty 56

## 2018-11-05 MED ORDER — SENNOSIDES-DOCUSATE SODIUM 8.6-50 MG PO TABS
2.0000 | ORAL_TABLET | ORAL | Status: DC
  Administered 2018-11-05 – 2018-11-06 (×2): 2 via ORAL
  Filled 2018-11-05 (×2): qty 2

## 2018-11-05 MED ORDER — OXYTOCIN 40 UNITS IN LACTATED RINGERS INFUSION - SIMPLE MED
20.0000 [IU]/h | INTRAVENOUS | Status: DC
  Administered 2018-11-05: 20 [IU]/h via INTRAVENOUS

## 2018-11-05 MED ORDER — MEASLES, MUMPS & RUBELLA VAC IJ SOLR: 0.5000 mL | Freq: Once | INTRAMUSCULAR | Status: DC

## 2018-11-05 MED ORDER — WITCH HAZEL-GLYCERIN EX PADS: 1.0000 "application " | MEDICATED_PAD | CUTANEOUS | Status: DC | PRN

## 2018-11-05 MED ORDER — IBUPROFEN 600 MG PO TABS
600.0000 mg | ORAL_TABLET | Freq: Four times a day (QID) | ORAL | Status: DC
  Administered 2018-11-05 – 2018-11-06 (×6): 600 mg via ORAL
  Filled 2018-11-05 (×6): qty 1

## 2018-11-05 MED ORDER — ZOLPIDEM TARTRATE 5 MG PO TABS: 5.0000 mg | ORAL_TABLET | Freq: Every evening | ORAL | Status: DC | PRN

## 2018-11-05 MED ORDER — COCONUT OIL OIL: 1.0000 "application " | TOPICAL_OIL | Status: DC | PRN

## 2018-11-05 MED ORDER — LACTATED RINGERS IV SOLN: INTRAVENOUS | Status: DC

## 2018-11-05 MED ORDER — OXYCODONE-ACETAMINOPHEN 5-325 MG PO TABS
1.0000 | ORAL_TABLET | ORAL | Status: DC | PRN
  Administered 2018-11-05 – 2018-11-06 (×2): 1 via ORAL
  Filled 2018-11-05 (×2): qty 1

## 2018-11-05 NOTE — Progress Notes (Signed)
Notified of PPH and interventions done to control bleeding. Md order to keep epidural in and capped until tomorrow. Dayshift MBU RN to call anesthesia for order to remove.

## 2018-11-05 NOTE — Addendum Note (Signed)
Addendum  created 11/05/18 0813 by Graciela HusbandsFussell, Lovella Hardie O, CRNA   Charge Capture section accepted, LDA properties accepted, Sign clinical note

## 2018-11-05 NOTE — Progress Notes (Signed)
Patient ID: Rachel Heath, female   DOB: 1991/06/16, [age] y.o.   MRN: [id number] RN called provider to the room as patient was still having heavy bleeding. Patient had gotten 800mcg Cytotec buccally, and IV pitocin at delivery. Fundus was firm on assessment, and RN had performed I&O cath. TXA was ordered and bleeding had normalized.  RN then called provider back as patient started to have increased bleeding again. At this time QBL was now about 970cc. On exam multiple clots noted in the lower uterine segment. Dr. Debroah LoopArnold called and asked to come to patient's bedside to assess bleeding. Dr. Debroah LoopArnold was able to manually removed about 175cc of clots from there lower uterine segment. CBC, DIC panel, methergine IM followed by PO series ordered.   Will continue to monitor bleeding and follow labs.   Total QBL at this time: 1154 cc  Thressa ShellerHeather Tylon Kemmerling 12:43 AM 11/05/18

## 2018-11-05 NOTE — Progress Notes (Signed)
POSTPARTUM PROGRESS NOTE  Post Partum Day 1  Subjective:  Rachel Heath is a 27 y.o. [phone] s/p SVD with PPH at 4246w2d.  No acute events overnight.  Pt denies problems with ambulating or po intake. Patient continues to have f[facility] this [facility]. She denies nausea or vomiting.  Pain is well controlled.  She has not had flatus. She has not had bowel movement.  Lochia Moderate.   Objective: Blood pressure 115/63, pulse 84, temperature 99.6 F (37.6 Heath), temperature source Oral, resp. rate 16, height 5\' 4"  (1.626 m), weight 88.9 kg, last menstrual period 02/16/2018, SpO2 99 %, unknown if currently breastfeeding.  Physical Exam:  General: alert, cooperative and no distress Chest: no respiratory distress Heart:regular rate, distal pulses intact Abdomen: soft, nontender,  Uterine Fundus: firm, appropriately tender DVT Evaluation: No calf swelling or tenderness Extremities: mild pedal edema  Recent Labs    11/05/18 0118 11/05/18 0633  HGB 10.7* 9.7*  HCT 31.4* 28.2*    Assessment/Plan: Rachel Pesayeasha M Vardaman is a 27 y.o. [id number] s/p SVD and PPH at 3346w2d   PPD#1 - Doing well Contraception: unsure Feeding: breast and formula  Dispo: Plan for discharge tomorrow.   LOS: 1 day   Sharyon CableRogers, Rachel Heath, CNM 11/05/2018, 11:28 AM

## 2018-11-05 NOTE — Anesthesia Postprocedure Evaluation (Signed)
Anesthesia Post Note  Patient: Rachel Heath  Procedure(s) Performed: AN AD HOC LABOR EPIDURAL     Patient location during evaluation: Mother Baby Anesthesia Type: Epidural Level of consciousness: awake and alert and oriented Pain management: satisfactory to patient Vital Signs Assessment: post-procedure vital signs reviewed and stable Respiratory status: respiratory function stable Cardiovascular status: stable Postop Assessment: no headache, no backache, epidural receding, patient able to bend at knees, no signs of nausea or vomiting and adequate PO intake Anesthetic complications: no    Last Vitals:  Vitals:   11/05/18 0310 11/05/18 0413  BP: 123/71 115/63  Pulse: 74 84  Resp: 18 16  Temp: (!) 38.1 C 37.6 C  SpO2: 97% 99%    Last Pain:  Vitals:   11/05/18 0413  TempSrc: Oral  PainSc: 3    Pain Goal: Patients Stated Pain Goal: 3 (11/05/18 0413)               Karleen DolphinFUSSELL,Paul Trettin

## 2018-11-06 MED ORDER — FERROUS SULFATE 325 (65 FE) MG PO TABS: 325.0000 mg | ORAL_TABLET | Freq: Two times a day (BID) | ORAL | 3 refills | Status: DC

## 2018-11-06 MED ORDER — SENNOSIDES-DOCUSATE SODIUM 8.6-50 MG PO TABS: 2.0000 | ORAL_TABLET | ORAL | 0 refills | Status: DC

## 2018-11-06 MED ORDER — IBUPROFEN 600 MG PO TABS: 600.0000 mg | ORAL_TABLET | Freq: Four times a day (QID) | ORAL | 1 refills | Status: DC

## 2018-11-06 MED ORDER — RHO D IMMUNE GLOBULIN 1500 UNIT/2ML IJ SOSY
300.0000 ug | PREFILLED_SYRINGE | Freq: Once | INTRAMUSCULAR | Status: AC
  Administered 2018-11-06: 300 ug via INTRAVENOUS
  Filled 2018-11-06: qty 2

## 2018-11-06 MED ORDER — FERROUS SULFATE 325 (65 FE) MG PO TABS
325.0000 mg | ORAL_TABLET | Freq: Two times a day (BID) | ORAL | Status: DC
  Administered 2018-11-06: 325 mg via ORAL
  Filled 2018-11-06: qty 1

## 2018-11-06 NOTE — Discharge Instructions (Signed)
Postpartum Care After Vaginal Delivery °The period of time right after you deliver your newborn is called the postpartum period. °What kind of medical care will I receive? °· You may continue to receive fluids and medicines through an IV tube inserted into one of your veins. °· If an incision was made near your vagina (episiotomy) or if you had some vaginal tearing during delivery, cold compresses may be placed on your episiotomy or your tear. This helps to reduce pain and swelling. °· You may be given a squirt bottle to use when you go to the bathroom. You may use this until you are comfortable wiping as usual. To use the squirt bottle, follow these steps: °? Before you urinate, fill the squirt bottle with warm water. Do not use hot water. °? After you urinate, while you are sitting on the toilet, use the squirt bottle to rinse the area around your urethra and vaginal opening. This rinses away any urine and blood. °? You may do this instead of wiping. As you start healing, you may use the squirt bottle before wiping yourself. Make sure to wipe gently. °? Fill the squirt bottle with clean water every time you use the bathroom. °· You will be given sanitary pads to wear. °How can I expect to feel? °· You may not feel the need to urinate for several hours after delivery. °· You will have some soreness and pain in your abdomen and vagina. °· If you are breastfeeding, you may have uterine contractions every time you breastfeed for up to several weeks postpartum. Uterine contractions help your uterus return to its normal size. °· It is normal to have vaginal bleeding (lochia) after delivery. The amount and appearance of lochia is often similar to a menstrual period in the first week after delivery. It will gradually decrease over the next few weeks to a dry, yellow-brown discharge. For most women, lochia stops completely by 6-8 weeks after delivery. Vaginal bleeding can vary from woman to woman. °· Within the first few  days after delivery, you may have breast engorgement. This is when your breasts feel heavy, full, and uncomfortable. Your breasts may also throb and feel hard, tightly stretched, warm, and tender. After this occurs, you may have milk leaking from your breasts. Your health care provider can help you relieve discomfort due to breast engorgement. Breast engorgement should go away within a few days. °· You may feel more sad or worried than normal due to hormonal changes after delivery. These feelings should not last more than a few days. If these feelings do not go away after several days, speak with your health care provider. °How should I care for myself? °· Tell your health care provider if you have pain or discomfort. °· Drink enough water to keep your urine clear or pale yellow. °· Wash your hands thoroughly with soap and water for at least 20 seconds after changing your sanitary pads, after using the toilet, and before holding or feeding your baby. °· If you are not breastfeeding, avoid touching your breasts a lot. Doing this can make your breasts produce more milk. °· If you become weak or lightheaded, or you feel like you might faint, ask for help before: °? Getting out of bed. °? Showering. °· Change your sanitary pads frequently. Watch for any changes in your flow, such as a sudden increase in volume, a change in color, the passing of large blood clots. If you pass a blood clot from your vagina, save it   to show to your health care provider. Do not flush blood clots down the toilet without having your health care provider look at them. °· Make sure that all your vaccinations are up to date. This can help protect you and your baby from getting certain diseases. You may need to have immunizations done before you leave the hospital. °· If desired, talk with your health care provider about methods of family planning or birth control (contraception). °How can I start bonding with my baby? °Spending as much time as  possible with your baby is very important. During this time, you and your baby can get to know each other and develop a bond. Having your baby stay with you in your room (rooming in) can give you time to get to know your baby. Rooming in can also help you become comfortable caring for your baby. Breastfeeding can also help you bond with your baby. °How can I plan for returning home with my baby? °· Make sure that you have a car seat installed in your vehicle. °? Your car seat should be checked by a certified car seat installer to make sure that it is installed safely. °? Make sure that your baby fits into the car seat safely. °· Ask your health care provider any questions you have about caring for yourself or your baby. Make sure that you are able to contact your health care provider with any questions after leaving the hospital. °This information is not intended to replace advice given to you by your health care provider. Make sure you discuss any questions you have with your health care provider. °Document Released: 10/03/2007 Document Revised: 05/10/2016 Document Reviewed: 11/10/2015 °Elsevier Interactive Patient Education © 2018 [employer] Inc. ° °

## 2018-11-06 NOTE — Discharge Summary (Signed)
OB Discharge Summary     Patient Name: Rachel Heath DOB: 10/16/1991 MRN: [id number]  Date of admission: 11/04/2018 Delivering MD: Thressa ShellerHOGAN, HEATHER D   Date of discharge: 11/06/2018  Admitting diagnosis: 37.2 WKS, CTXS Intrauterine pregnancy: 6834w2d     Secondary diagnosis:  Active Problems:   Normal labor  Additional problems:  Sickle Cell trait  Vit D Deficiency  Rh neg      Discharge diagnosis: Term Pregnancy Delivered                                                                                                Post partum procedures:rhogam  Augmentation: AROM  Complications: Hemorrhage>10300mL  Hospital course:  Onset of Labor With Vaginal Delivery     [age] y.o. yo [id number] at 8734w2d was admitted in Active Labor on 11/04/2018. Patient had an uncomplicated labor course as follows:  Membrane Rupture Time/Date: 10:02 PM ,11/04/2018   Intrapartum Procedures: Episiotomy: None [1]                                         Lacerations:  None [1]  Patient had a delivery of a Viable infant. 11/04/2018  Information for the patient's newborn:  Lajuana MatteJones, Girl Kadelyn [[id number]][[id number]]  Delivery Method: Vaginal, Spontaneous(Filed from Delivery Summary)    Pateint had an uncomplicated postpartum course.  She is ambulating, tolerating a regular diet, passing flatus, and urinating well. Patient is discharged home in stable condition on 11/06/18.   Physical exam  Vitals:   11/05/18 1236 11/05/18 1630 11/05/18 2012 11/06/18 0519  BP: 102/63 117/83 124/81 122/87  Pulse: 73 71 63 (!) 54  Resp: 16 18 18    Temp: 98.2 F (36.8 C) 98 F (36.7 C) 98.3 F (36.8 C) (!) 97.5 F (36.4 C)  TempSrc: Oral Oral Oral Oral  SpO2: 100% 100% 100% 100%  Weight:      Height:       General: alert and cooperative Lochia: appropriate Uterine Fundus: firm Incision: N/A DVT Evaluation: No evidence of DVT seen on physical exam. Labs: Lab Results  Component Value Date   WBC 15.9 (H) 11/05/2018   HGB 9.7  (L) 11/05/2018   HCT 28.2 (L) 11/05/2018   MCV 83.7 11/05/2018   PLT 236 11/05/2018   CMP Latest Ref Rng & Units 07/04/2014  Glucose 70 - 99 mg/dL 841(L164(H)  BUN 6 - 23 mg/dL 8  Creatinine 2.440.50 - 0.101.10 mg/dL 2.720.70  Sodium 536137 - 644147 mEq/L 146  Potassium 3.7 - 5.3 mEq/L 3.4(L)  Chloride 96 - 112 mEq/L 106  CO2 19 - 32 mEq/L 25  Calcium 8.4 - 10.5 mg/dL 8.7  Total Protein 6.0 - 8.3 g/dL 7.1  Total Bilirubin 0.3 - 1.2 mg/dL 0.4  Alkaline Phos 39 - 117 U/L 74  AST 0 - 37 U/L 22  ALT 0 - 35 U/L 11    Discharge instruction: per After Visit Summary and "Baby and Me Booklet".  After visit meds:  Allergies as of 11/06/2018  No Known Allergies     Medication List    TAKE these medications   ferrous sulfate 325 (65 FE) MG tablet Take 1 tablet (325 mg total) by mouth 2 (two) times daily with a meal.   ibuprofen 600 MG tablet Commonly known as:  ADVIL,MOTRIN Take 1 tablet (600 mg total) by mouth every 6 (six) hours.   PNV PO Take by mouth.   senna-docusate 8.6-50 MG tablet Commonly known as:  Senokot-S Take 2 tablets by mouth daily. Start taking on:  11/07/2018       Diet: routine diet  Activity: Advance as tolerated. Pelvic rest for 6 weeks.   Outpatient follow up:4 weeks Follow up Appt:No future appointments. Follow up Visit:No follow-ups on file.  Postpartum contraception: IUD Mirena  Newborn Data: Live born female  Birth Weight: 6 lb 4 oz (2835 g) APGAR: 9, 9  Newborn Delivery   Birth date/time:  11/04/2018 22:04:00 Delivery type:  Vaginal, Spontaneous     Baby Feeding: Bottle and Breast Disposition:home with mother   11/06/2018 De Hollingshead, DO

## 2018-11-06 NOTE — Lactation Note (Signed)
This note was copied from a baby's chart. Lactation Consultation Note Baby 32 hrs old. Mom states she has only put the baby to the breast 2 times. Mom's feeding choice at this time is breast/formula. Mom states she will give formula until her milk comes in then she will BF more.  Mom has a 4 yr ol that she put the breast 2 times, then her 25 yr old a couple of months. Asked mom if she is having trouble latching? Mom stated that the baby isn't wanting it much right now because there isn't anything there. Newborn behavior, feeding habits, STS, I&O, supply and demand discussed briefly. LC isn't sure about mom's interest in BF. Mom has tubular breast w/short shaft compressible breast. Mom encouraged to feed baby 8-12 times/24 hours and with feeding cues.  Encouraged to call for assistance or questions. WH/LC brochure given w/resources, support groups and LC services.  Patient Name: Girl Essie Hartyeasha Banet JXBJY'NToday's Date: 11/06/2018 Reason for consult: Initial assessment;Early term 37-38.6wks   Maternal Data Has patient been taught Hand Expression?: Yes Does the patient have breastfeeding experience prior to this delivery?: Yes  Feeding Feeding Type: Bottle Fed - Formula Nipple Type: Slow - flow  LATCH Score       Type of Nipple: Everted at rest and after stimulation  Comfort (Breast/Nipple): Soft / non-tender        Interventions Interventions: Breast feeding basics reviewed;Hand express;Breast compression  Lactation Tools Discussed/Used WIC Program: Yes   Consult Status Consult Status: Follow-up Date: 11/07/18 Follow-up type: In-patient    Charyl DancerCARVER, Ragna Kramlich G 11/06/2018, 6:55 AM

## 2018-11-07 LAB — RH IG WORKUP (INCLUDES ABO/RH)
ABO/RH(D): O NEG
Fetal Screen: NEGATIVE
GESTATIONAL AGE(WKS): 37
UNIT DIVISION: 0

## 2018-11-08 LAB — BPAM RBC
BLOOD PRODUCT EXPIRATION DATE: 201912022359
BLOOD PRODUCT EXPIRATION DATE: 201912082359
UNIT TYPE AND RH: 9500
Unit Type and Rh: 9500

## 2018-11-08 LAB — TYPE AND SCREEN
ABO/RH(D): O NEG
Antibody Screen: POSITIVE
UNIT DIVISION: 0
Unit division: 0

## 2019-02-14 ENCOUNTER — Encounter (HOSPITAL_COMMUNITY): Payer: Self-pay | Admitting: Emergency Medicine

## 2019-02-14 ENCOUNTER — Ambulatory Visit (HOSPITAL_COMMUNITY)
Admission: EM | Admit: 2019-02-14 | Discharge: 2019-02-14 | Disposition: A | Discharge status: Home / Self Care | Payer: Medicaid Other | Attending: Emergency Medicine | Admitting: Emergency Medicine

## 2019-02-14 ENCOUNTER — Other Ambulatory Visit: Payer: Self-pay

## 2019-02-14 DIAGNOSIS — R6883 Chills (without fever): Secondary | ICD-10-CM

## 2019-02-14 DIAGNOSIS — L738 Other specified follicular disorders: Secondary | ICD-10-CM

## 2019-02-14 MED ORDER — SULFAMETHOXAZOLE-TRIMETHOPRIM 800-160 MG PO TABS: 1.0000 | ORAL_TABLET | Freq: Two times a day (BID) | ORAL | 0 refills | Status: DC

## 2019-02-14 NOTE — ED Triage Notes (Signed)
Pt reports an abscess to her right labia that she first noticed four days ago.  She also reports chills, body aches and dizziness since yesterday.

## 2019-02-14 NOTE — Discharge Instructions (Addendum)
Since area is already draining we do not need to open at this time if area becomes worse you will need to return Use warm compresses

## 2019-02-14 NOTE — ED Provider Notes (Signed)
[facility]    CSN: [id number] Arrival date & time: 02/14/19  1657     History   Chief Complaint Chief Complaint  Patient presents with  . Generalized Body Aches  . Chills  . vaginal abscess    HPI Rachel Heath is a 28 y.o. female.   Pt has an ingrown hair to labia area for 4 days that is draining now after shaving . States that it is painful and she is having chills. Has been applying neosporin oint . Moderate amount of drainage      Past Medical History:  Diagnosis Date  . Ganglion cyst    right hand  . Heart murmur    per parent  . Sickle cell trait [facility])     Patient Active Problem List   Diagnosis Date Noted  . Normal labor 11/04/2018  . First trimester screening   . [redacted] weeks gestation of pregnancy   . Heart murmur   . Family history of sickle cell trait   . Postpartum care and examination 05/25/2017  . Contraception management 05/25/2017  . Sickle cell trait (HCC) 10/25/2016  . Low vitamin D level 10/04/2016    Past Surgical History:  Procedure Laterality Date  . HAND SURGERY      OB History    Gravida  5   Para  3   Term  3   Preterm      AB  2   Living  3     SAB  1   TAB  1   Ectopic      Multiple  0   Live Births  3            Home Medications    Prior to Admission medications   Medication Sig Start Date End Date Taking? Authorizing Provider  ferrous sulfate 325 (65 FE) MG tablet Take 1 tablet (325 mg total) by mouth 2 (two) times daily with a meal. 11/06/18   Arvilla Market, DO  ibuprofen (ADVIL,MOTRIN) 600 MG tablet Take 1 tablet (600 mg total) by mouth every 6 (six) hours. 11/06/18   Arvilla Market, DO  Prenatal Vit w/Fe-Methylfol-FA (PNV PO) Take by mouth.    [provider]  senna-docusate (SENOKOT-S) 8.6-50 MG tablet Take 2 tablets by mouth daily. 11/07/18   Arvilla Market, DO  sulfamethoxazole-trimethoprim (BACTRIM DS,SEPTRA DS) 800-160 MG tablet Take 1  tablet by mouth 2 (two) times daily. 02/14/19   Coralyn Mark, NP    Family History History reviewed. No pertinent family history.  Social History Social History   Tobacco Use  . Smoking status: Former Smoker    Types: Cigars    Last attempt to quit: 05/19/2016    Years since quitting: 2.7  . Smokeless tobacco: Never Used  Substance Use Topics  . Alcohol use: No  . Drug use: No    Frequency: 3.0 times per week     Allergies   Patient has no known allergies.   Review of Systems Review of Systems  Constitutional: Positive for chills and fever.  Respiratory: Negative.   Cardiovascular: Negative.   Gastrointestinal: Negative.   Genitourinary: Positive for vaginal pain.  Skin: Positive for wound.       Ingrown hair to rt labia area with small open area , draining      Physical Exam Triage Vital Signs ED Triage Vitals  Enc Vitals Group     BP 02/14/19 1742 126/78     Pulse  Rate 02/14/19 1742 (!) 121     Resp --      Temp 02/14/19 1742 100 F (37.8 C)     Temp Source 02/14/19 1742 Temporal     SpO2 02/14/19 1742 100 %     Weight --      Height --      Head Circumference --      Peak Flow --      Pain Score 02/14/19 1743 8     Pain Loc --      Pain Edu? --      Excl. in GC? --    No data found.  Updated Vital Signs BP 126/78 (BP Location: Right Arm)   Pulse (!) 121   Temp 100 F (37.8 C) (Temporal)   LMP 01/27/2019 (Approximate)   SpO2 100%   Visual Acuity Right Eye Distance:   Left Eye Distance:   Bilateral Distance:    Right Eye Near:   Left Eye Near:    Bilateral Near:     Physical Exam Constitutional:      General: She is in acute distress.  Cardiovascular:     Rate and Rhythm: Normal rate.     Pulses: Normal pulses.  Pulmonary:     Effort: Pulmonary effort is normal.  Abdominal:     General: Abdomen is flat.  Skin:    Findings: Erythema and lesion present.     Comments: Small pin point area with thick white drainage to area rt  labia, erythema   Neurological:     Mental Status: She is alert.      UC Treatments / Results  Labs (all labs ordered are listed, but only abnormal results are displayed) Labs Reviewed - No data to display  EKG None  Radiology No results found.  Procedures Procedures (including critical care time)  Medications Ordered in UC Medications - No data to display  Initial Impression / Assessment and Plan / UC Course  I have reviewed the triage vital signs and the nursing notes.  Pertinent labs & imaging results that were available during my care of the patient were reviewed by me and considered in my medical decision making (see chart for details).    Take full dose of abx  Use warm compress    Final Clinical Impressions(s) / UC Diagnoses   Final diagnoses:  Chills  Pseudofolliculitis     Discharge Instructions     Since area is already draining we do not need to open at this time if area becomes worse you will need to return Use warm compresses     ED Prescriptions    Medication Sig Dispense Auth. Provider   sulfamethoxazole-trimethoprim (BACTRIM DS,SEPTRA DS) 800-160 MG tablet Take 1 tablet by mouth 2 (two) times daily. 10 tablet Coralyn Mark, NP     Controlled Substance Prescriptions [place] Controlled Substance Registry consulted? Not Applicable   Coralyn Mark, NP 02/14/19 1836

## 2019-02-24 ENCOUNTER — Ambulatory Visit (HOSPITAL_COMMUNITY)
Admission: EM | Admit: 2019-02-24 | Discharge: 2019-02-24 | Disposition: A | Discharge status: Home / Self Care | Payer: Medicaid Other | Attending: Family Medicine | Admitting: Family Medicine

## 2019-02-24 ENCOUNTER — Encounter (HOSPITAL_COMMUNITY): Payer: Self-pay

## 2019-02-24 DIAGNOSIS — R21 Rash and other nonspecific skin eruption: Secondary | ICD-10-CM | POA: Diagnosis not present

## 2019-02-24 MED ORDER — MUPIROCIN 2 % EX OINT: 1.0000 "application " | TOPICAL_OINTMENT | Freq: Three times a day (TID) | CUTANEOUS | 1 refills | Status: DC

## 2019-02-24 MED ORDER — VALACYCLOVIR HCL 1 G PO TABS: 1000.0000 mg | ORAL_TABLET | Freq: Three times a day (TID) | ORAL | 0 refills | Status: AC

## 2019-02-24 NOTE — ED Provider Notes (Signed)
[facility]    CSN: [id number] Arrival date & time: 02/24/19  1311     History   Chief Complaint Chief Complaint  Patient presents with  . Recurrent Skin Infections    HPI Rachel Heath is a 28 y.o. female.   Pt present abscess to her right labia.  She is returning because the abscess has progressively gotten worse.     Note from 02/14/19:  Pt has an ingrown hair to labia area for 4 days that is draining now after shaving . States that it is painful and she is having chills. Has been applying neosporin oint . Moderate amount of drainage  patient was treated with Septra     Past Medical History:  Diagnosis Date  . Ganglion cyst    right hand  . Heart murmur    per parent  . Sickle cell trait [facility])     Patient Active Problem List   Diagnosis Date Noted  . Normal labor 11/04/2018  . First trimester screening   . [redacted] weeks gestation of pregnancy   . Heart murmur   . Family history of sickle cell trait   . Postpartum care and examination 05/25/2017  . Contraception management 05/25/2017  . Sickle cell trait (HCC) 10/25/2016  . Low vitamin D level 10/04/2016    Past Surgical History:  Procedure Laterality Date  . HAND SURGERY      OB History    Gravida  5   Para  3   Term  3   Preterm      AB  2   Living  3     SAB  1   TAB  1   Ectopic      Multiple  0   Live Births  3            Home Medications    Prior to Admission medications   Medication Sig Start Date End Date Taking? Authorizing Provider  ferrous sulfate 325 (65 FE) MG tablet Take 1 tablet (325 mg total) by mouth 2 (two) times daily with a meal. 11/06/18   Arvilla Market, DO  ibuprofen (ADVIL,MOTRIN) 600 MG tablet Take 1 tablet (600 mg total) by mouth every 6 (six) hours. 11/06/18   Arvilla Market, DO  mupirocin ointment (BACTROBAN) 2 % Apply 1 application topically 3 (three) times daily. 02/24/19   Elvina Sidle, MD  Prenatal Vit  w/Fe-Methylfol-FA (PNV PO) Take by mouth.    [provider]  senna-docusate (SENOKOT-S) 8.6-50 MG tablet Take 2 tablets by mouth daily. 11/07/18   Arvilla Market, DO  valACYclovir (VALTREX) 1000 MG tablet Take 1 tablet (1,000 mg total) by mouth 3 (three) times daily for 14 days. 02/24/19 03/10/19  Elvina Sidle, MD    Family History History reviewed. No pertinent family history.  Social History Social History   Tobacco Use  . Smoking status: Former Smoker    Types: Cigars    Last attempt to quit: 05/19/2016    Years since quitting: 2.7  . Smokeless tobacco: Never Used  Substance Use Topics  . Alcohol use: No  . Drug use: No    Frequency: 3.0 times per week     Allergies   Patient has no known allergies.   Review of Systems Review of Systems   Physical Exam Triage Vital Signs ED Triage Vitals  Enc Vitals Group     BP 02/24/19 1344 126/71     Pulse Rate 02/24/19 1344 75  Resp 02/24/19 1344 16     Temp 02/24/19 1344 98.4 F (36.9 C)     Temp Source 02/24/19 1344 Oral     SpO2 02/24/19 1344 100 %     Weight --      Height --      Head Circumference --      Peak Flow --      Pain Score 02/24/19 1347 5     Pain Loc --      Pain Edu? --      Excl. in GC? --    No data found.  Updated Vital Signs BP 126/71 (BP Location: Right Arm)   Pulse 75   Temp 98.4 F (36.9 C) (Oral)   Resp 16   LMP 01/27/2019 (Approximate)   SpO2 100%   Physical Exam Vitals signs and nursing note reviewed.  Constitutional:      Appearance: Normal appearance.  Musculoskeletal: Normal range of motion.  Skin:    Findings: Erythema and rash present.     Comments: Posterior right labia and corresponding patches left labia are denuded and ulcerated  Neurological:     Mental Status: She is alert.      UC Treatments / Results  Labs (all labs ordered are listed, but only abnormal results are displayed) Labs Reviewed - No data to  display  EKG None  Radiology No results found.  Procedures Procedures (including critical care time)  Medications Ordered in UC Medications - No data to display  Initial Impression / Assessment and Plan / UC Course  I have reviewed the triage vital signs and the nursing notes.  Pertinent labs & imaging results that were available during my care of the patient were reviewed by me and considered in my medical decision making (see chart for details).     Final Clinical Impressions(s) / UC Diagnoses   Final diagnoses:  Rash and nonspecific skin eruption     Discharge Instructions     Soak in tub using Domeboro or Bluboro powder in the bath water, twice daily    ED Prescriptions    Medication Sig Dispense Auth. Provider   valACYclovir (VALTREX) 1000 MG tablet Take 1 tablet (1,000 mg total) by mouth 3 (three) times daily for 14 days. 21 tablet Elvina Sidle, MD   mupirocin ointment (BACTROBAN) 2 % Apply 1 application topically 3 (three) times daily. 22 g Elvina Sidle, MD     Controlled Substance Prescriptions [place] Controlled Substance Registry consulted? Yes, I have consulted the [place] Controlled Substances Registry for this patient, and feel the risk/benefit ratio today is favorable for proceeding with this prescription for a controlled substance.   Elvina Sidle, MD 02/24/19 1402

## 2019-02-24 NOTE — Discharge Instructions (Addendum)
Soak in tub using Domeboro or Bluboro powder in the bath water, twice daily

## 2019-02-24 NOTE — ED Triage Notes (Signed)
Pt present abscess to her right labia.  She is returning because the abscess has progressively getting worst.

## 2019-02-26 LAB — HSV CULTURE AND TYPING

## 2019-02-27 ENCOUNTER — Telehealth (HOSPITAL_COMMUNITY): Payer: Self-pay | Admitting: Emergency Medicine

## 2019-02-27 LAB — AEROBIC CULTURE W GRAM STAIN (SUPERFICIAL SPECIMEN): Gram Stain: NONE SEEN

## 2019-02-27 NOTE — Telephone Encounter (Signed)
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 Pt states she thinks her abscess is getting better, pt is upset about HSV diagnosis, told patient to call back if for at any reason she is not feeling better or her abscess gets worse. Pt agreeable to plan, all questions answered.

## 2020-10-02 ENCOUNTER — Ambulatory Visit: Payer: Self-pay | Admitting: Family Medicine

## 2020-10-02 VITALS — BP 112/76 | HR 88

## 2020-10-02 DIAGNOSIS — W57XXXA Bitten or stung by nonvenomous insect and other nonvenomous arthropods, initial encounter: Secondary | ICD-10-CM

## 2020-10-02 DIAGNOSIS — S60460A Insect bite (nonvenomous) of right index finger, initial encounter: Secondary | ICD-10-CM

## 2020-10-02 NOTE — Progress Notes (Signed)
  Subjective:     Patient ID: Rachel Heath, female   DOB: [birth year], 29 y.o.   MRN: [id number]  HPI Chrystel [facility] today with complaints of possible bug bite. She reports waking up yesterday with itching and swelling to her right index finger. States the itching has improved but reports increased pain/tenderness to the tip of her right index finger and tightness/swelling that extends up her right hand and arm. She denies any other symptoms. She has tried hydrocortisone cream and ice.   Review of Systems  Constitutional: Negative for activity change, appetite change, chills and fever.  Eyes: Negative for visual disturbance.  Respiratory: Negative for chest tightness and shortness of breath.   Cardiovascular: Negative for chest pain.  Gastrointestinal: Negative for abdominal pain, nausea and vomiting.  Skin: Negative for rash.  Neurological: Negative for dizziness, weakness and headaches.       Objective:   Physical Exam Vitals reviewed.  Constitutional:      General: She is not in acute distress.    Appearance: Normal appearance. She is not toxic-appearing.  HENT:     Head: Normocephalic and atraumatic.  Eyes:     General:        Right eye: No discharge.        Left eye: No discharge.  Cardiovascular:     Rate and Rhythm: Normal rate and regular rhythm.     Pulses: Normal pulses.     Heart sounds: Normal heart sounds.  Pulmonary:     Effort: Pulmonary effort is normal. No respiratory distress.     Breath sounds: Normal breath sounds.  Skin:    General: Skin is warm and dry.     Capillary Refill: Capillary refill takes less than 2 seconds.     Comments: Small hard tender nodule noted to medial aspect of right index finger. Mild swelling noted to her right index finger. Pt reports right arm feels swollen, but on exam in comparison with her left arm it appears normal. Radial pulses 2+. ROM intact. No surrounding erythema or  ecchymosis.   Neurological:     Mental Status: She is alert and oriented to person, place, and time.  Psychiatric:        Mood and Affect: Mood normal.        Behavior: Behavior normal.        Assessment:     Insect bite of right index finger, initial encounter      Plan:     1. On exam it appears to be some sort of insect bite to right index finger. No current s/s of infection. Recommend conservative treatment options with otc antihistamine use and ice application. May use otc ibuprofen to help with pain and swelling. Should she notice worsening of her symptoms over the next few days, or develops redness, purulent drainage, or fever she should go to urgent care for further evaluation and treatment.

## 2020-12-17 ENCOUNTER — Ambulatory Visit (INDEPENDENT_AMBULATORY_CARE_PROVIDER_SITE_OTHER): Payer: Medicaid Other

## 2020-12-17 ENCOUNTER — Other Ambulatory Visit: Payer: Self-pay

## 2020-12-17 DIAGNOSIS — N912 Amenorrhea, unspecified: Secondary | ICD-10-CM | POA: Diagnosis not present

## 2020-12-17 DIAGNOSIS — Z3490 Encounter for supervision of normal pregnancy, unspecified, unspecified trimester: Secondary | ICD-10-CM

## 2020-12-17 LAB — POCT URINE PREGNANCY: Preg Test, Ur: POSITIVE — AB

## 2020-12-17 MED ORDER — BLOOD PRESSURE MONITOR KIT: 1.0000 | PACK | 0 refills | Status: DC

## 2020-12-17 NOTE — Progress Notes (Signed)
Ms. Rachel Heath presents today for UPT. She has no unusual complaints. LMP: 11/09/2020    OBJECTIVE: Appears well, in no apparent distress.  OB History    Gravida  5   Para  3   Term  3   Preterm      AB  2   Living  3     SAB  1   IAB  1   Ectopic      Multiple  0   Live Births  3          Home UPT Result: 2 + test at home  In-Office UPT result: Positive  I have reviewed the patient's medical, obstetrical, social, and family histories, and medications.   ASSESSMENT: Positive pregnancy test  PLAN Prenatal care to be completed at: [facility]  B/P [facility]  NOB labs will be drawn at visit  PHQ-9 = 0

## 2020-12-18 NOTE — Progress Notes (Signed)
Patient was assessed and managed by nursing staff during this encounter. I have reviewed the chart and agree with the documentation and plan. I have also made any necessary editorial changes.  Warden Fillers, MD 12/18/2020 8:17 AM

## 2020-12-20 NOTE — L&D Delivery Note (Addendum)
LABOR COURSE Rachel Heath went into labor spontaneously at home and presented to the MAU for admission. She was 3.5/60/-3 on presentation and was brought to the L&D floor. She had an epidural placed for pain control and progressed rapidly to 8/thin/0. We ruptured her membranes at 1722 and baby's head descended to +1 station.   Delivery Note Called to room and patient was complete and pushing. Head delivered LOA. There was a single nuchal cord that was loose and easily reduced. Shoulder and body delivered in usual fashion. At 1748 a healthy female was delivered via Vaginal, Spontaneous vertex presentation.  Infant with spontaneous cry, placed on mother's abdomen, dried and stimulated. Cord clamped x 2 after 1-minute delay, and cut by FOB. Cord blood drawn. Placenta delivered spontaneously with gentle cord traction. Appears intact with marginal clot. Uterus was initially boggy with passage of clots but became firm with fundal massage, Pitocin, Methergine, and tranexamic acid.  Labia, perineum, vagina, and cervix inspected with second-degree perineal tear that was repaired using 3-0 Vicryl suture.    APGAR: 9,9 ;  3141g   Cord: 3VC with the following complications:None.    Anesthesia:  Epidural Episiotomy: None Lacerations: 2nd degree perineal Suture Repair: 3.0 vicryl Est. Blood Loss (mL):  Mom to postpartum.  Baby to Couplet care / Skin to Skin.  Rachel Amel, MD 08/06/21 6:40 PM    I was gowned and gloved and present for entire delivery and repair.  Warner Mccreedy, MD, MPH OB Fellow, Faculty Practice

## 2021-01-28 ENCOUNTER — Other Ambulatory Visit (HOSPITAL_COMMUNITY)
Admission: RE | Admit: 2021-01-28 | Discharge: 2021-01-28 | Disposition: A | Discharge status: Home / Self Care | Payer: Medicaid Other | Source: Ambulatory Visit | Attending: Women's Health | Admitting: Women's Health

## 2021-01-28 ENCOUNTER — Encounter: Payer: Self-pay | Admitting: Women's Health

## 2021-01-28 ENCOUNTER — Other Ambulatory Visit: Payer: Self-pay

## 2021-01-28 ENCOUNTER — Ambulatory Visit (INDEPENDENT_AMBULATORY_CARE_PROVIDER_SITE_OTHER): Payer: Medicaid Other | Admitting: Women's Health

## 2021-01-28 VITALS — BP 120/73 | HR 80 | Wt 206.0 lb

## 2021-01-28 DIAGNOSIS — Z3481 Encounter for supervision of other normal pregnancy, first trimester: Secondary | ICD-10-CM

## 2021-01-28 DIAGNOSIS — O99210 Obesity complicating pregnancy, unspecified trimester: Secondary | ICD-10-CM | POA: Insufficient documentation

## 2021-01-28 DIAGNOSIS — R011 Cardiac murmur, unspecified: Secondary | ICD-10-CM

## 2021-01-28 DIAGNOSIS — Z3480 Encounter for supervision of other normal pregnancy, unspecified trimester: Secondary | ICD-10-CM | POA: Insufficient documentation

## 2021-01-28 DIAGNOSIS — Z3A11 11 weeks gestation of pregnancy: Secondary | ICD-10-CM

## 2021-01-28 DIAGNOSIS — R7989 Other specified abnormal findings of blood chemistry: Secondary | ICD-10-CM

## 2021-01-28 DIAGNOSIS — D573 Sickle-cell trait: Secondary | ICD-10-CM

## 2021-01-28 NOTE — Patient Instructions (Addendum)
Maternity Assessment Unit (MAU)  The Maternity Assessment Unit (MAU) is located at the [facility]. The address is: [address], [place], [place], [place] [address]. Please see map below for additional directions.    The Maternity Assessment Unit is designed to help you during your pregnancy, and for up to 6 weeks after delivery, with any pregnancy- or postpartum-related emergencies, if you think you are in labor, or if your water has broken. For example, if you experience nausea and vomiting, vaginal bleeding, severe abdominal or pelvic pain, elevated blood pressure or other problems related to your pregnancy or postpartum time, please come to the Maternity Assessment Unit for assistance.                        Safe Medications in Pregnancy    Acne: Benzoyl Peroxide Salicylic Acid  Backache/Headache: Tylenol: 2 regular strength every 4 hours OR              2 Extra strength every 6 hours  Colds/Coughs/Allergies: Benadryl (alcohol free) 25 mg every 6 hours as needed Breath right strips Claritin Cepacol throat lozenges Chloraseptic throat spray Cold-Eeze- up to three times per day Cough drops, alcohol free Flonase (by prescription only) Guaifenesin Mucinex Robitussin DM (plain only, alcohol free) Saline nasal spray/drops Sudafed (pseudoephedrine) & Actifed ** use only after [redacted] weeks gestation and if you do not have high blood pressure Tylenol Vicks Vaporub Zinc lozenges Zyrtec   Constipation: Colace Ducolax suppositories Fleet enema Glycerin suppositories Metamucil Milk of magnesia Miralax Senokot Smooth move tea  Diarrhea: Kaopectate Imodium A-D  *NO pepto Bismol  Hemorrhoids: Anusol Anusol HC Preparation H Tucks  Indigestion: Tums Maalox Mylanta Zantac  Pepcid  Insomnia: Benadryl (alcohol free) 4m every 6 hours as needed Tylenol PM Unisom, no Gelcaps  Leg  Cramps: Tums MagGel  Nausea/Vomiting:  Bonine Dramamine Emetrol Ginger extract Sea bands Meclizine  Nausea medication to take during pregnancy:  Unisom (doxylamine succinate 25 mg tablets) Take one tablet daily at bedtime. If symptoms are not adequately controlled, the dose can be increased to a maximum recommended dose of two tablets daily (1/2 tablet in the morning, 1/2 tablet mid-afternoon and one at bedtime). Vitamin B6 1051mtablets. Take one tablet twice a day (up to 200 mg per day).  Skin Rashes: Aveeno products Benadryl cream or 2528mvery 6 hours as needed Calamine Lotion 1% cortisone cream  Yeast infection: Gyne-lotrimin 7 Monistat 7   **If taking multiple medications, please check labels to avoid duplicating the same active ingredients **take medication as directed on the label ** Do not exceed 4000 mg of tylenol in 24 hours **Do not take medications that contain aspirin or ibuprofen          Rh0 [D] Immune Globulin injection What is this medicine? RhO [D] IMMUNE GLOBULIN (i MYOON GLOB yoo lin) is used to treat idiopathic thrombocytopenic purpura (ITP). This medicine is used in RhO negative mothers who are pregnant with a RhO positive child. It is also used after a transfusion of RhO positive blood into a RhO negative person. This medicine may be used for other purposes; ask your health care provider or pharmacist if you have questions. COMMON BRAND NAME(S): BayRho-D, HyperRHO S/D, MICRhoGAM, RhoGAM, Rhophylac, WinRho SDF What should I tell my health care provider before I take this medicine? They need to know if you have any of these conditions:  bleeding disorders  low levels of  immunoglobulin A in the body  no spleen  an unusual or allergic reaction to human immune globulin, other medicines, foods, dyes, or preservatives  pregnant or trying to get pregnant  breast-feeding How should I use this medicine? This medicine is for injection into a  muscle or into a vein. It is given by a health care professional in a hospital or clinic setting. Talk to your pediatrician regarding the use of this medicine in children. This medicine is not approved for use in children. Overdosage: If you think you have taken too much of this medicine contact a poison control center or emergency room at once. NOTE: This medicine is only for you. Do not share this medicine with others. What if I miss a dose? It is important not to miss your dose. Call your doctor or health care professional if you are unable to keep an appointment. What may interact with this medicine?  live virus vaccines, like measles, mumps, or rubella This list may not describe all possible interactions. Give your health care provider a list of all the medicines, herbs, non-prescription drugs, or dietary supplements you use. Also tell them if you smoke, drink alcohol, or use illegal drugs. Some items may interact with your medicine. What should I watch for while using this medicine? This medicine is made from human blood. It may be possible to pass an infection in this medicine. Talk to your doctor about the risks and benefits of this medicine. This medicine may interfere with live virus vaccines. Before you get live virus vaccines tell your health care professional if you have received this medicine within the past 3 months. What side effects may I notice from receiving this medicine? Side effects that you should report to your doctor or health care professional as soon as possible:  allergic reactions like skin rash, itching or hives, swelling of the face, lips, or tongue  breathing problems  chest pain or tightness  yellowing of the eyes or skin Side effects that usually do not require medical attention (report to your doctor or health care professional if they continue or are bothersome):  fever  pain and tenderness at site where injected This list may not describe all possible  side effects. Call your doctor for medical advice about side effects. You may report side effects to FDA at 1-800-FDA-1088. Where should I keep my medicine? This drug is given in a hospital or clinic and will not be stored at home. NOTE: This sheet is a summary. It may not cover all possible information. If you have questions about this medicine, talk to your doctor, pharmacist, or health care provider.  2021 Elsevier/Gold Standard (2008-08-05 14:06:10)        Obstetrics: Normal and Problem Pregnancies (7th ed., pp. 102-121). [place], [place]: Elsevier."> Textbook of Family Medicine (9th ed., pp. [phone]). [place], [place]: Elsevier Saunders.">  First Trimester of Pregnancy  The first trimester of pregnancy starts on the first day of your last menstrual period until the end of week 12. This is months 1 through 3 of pregnancy. A week after a sperm fertilizes an egg, the egg will implant into the wall of the uterus and begin to develop into a baby. By the end of 12 weeks, all the baby's organs will be formed and the baby will be 2-3 inches in size. Body changes during your first trimester Your body goes through many changes during pregnancy. The changes vary and generally return to normal after your baby is born. Physical changes  You may gain or lose weight.  Your breasts may begin to grow larger and become tender. The tissue that surrounds your nipples (areola) may become darker.  Dark spots or blotches (chloasma or mask of pregnancy) may develop on your face.  You may have changes in your hair. These can include thickening or thinning of your hair or changes in texture. Health changes  You may feel nauseous, and you may vomit.  You may have heartburn.  You may develop headaches.  You may develop constipation.  Your gums may bleed and may be sensitive to brushing and flossing. Other changes  You may tire easily.  You may urinate more often.  Your menstrual periods will  stop.  You may have a loss of appetite.  You may develop cravings for certain kinds of food.  You may have changes in your emotions from day to day.  You may have more vivid and strange dreams. Follow these instructions at home: Medicines  Follow your health care provider's instructions regarding medicine use. Specific medicines may be either safe or unsafe to take during pregnancy. Do not take any medicines unless told to by your health care provider.  Take a prenatal vitamin that contains at least 600 micrograms (mcg) of folic acid. Eating and drinking  Eat a healthy diet that includes fresh fruits and vegetables, whole grains, good sources of protein such as meat, eggs, or tofu, and low-fat dairy products.  Avoid raw meat and unpasteurized juice, milk, and cheese. These carry germs that can harm you and your baby.  If you feel nauseous or you vomit: ? Eat 4 or 5 small meals a day instead of 3 large meals. ? Try eating a few soda crackers. ? Drink liquids between meals instead of during meals.  You may need to take these actions to prevent or treat constipation: ? Drink enough fluid to keep your urine pale yellow. ? Eat foods that are high in fiber, such as beans, whole grains, and fresh fruits and vegetables. ? Limit foods that are high in fat and processed sugars, such as fried or sweet foods. Activity  Exercise only as directed by your health care provider. Most people can continue their usual exercise routine during pregnancy. Try to exercise for 30 minutes at least 5 days a week.  Stop exercising if you develop pain or cramping in the lower abdomen or lower back.  Avoid exercising if it is very hot or humid or if you are at high altitude.  Avoid heavy lifting.  If you choose to, you may have sex unless your health care provider tells you not to. Relieving pain and discomfort  Wear a good support bra to relieve breast tenderness.  Rest with your legs elevated if you  have leg cramps or low back pain.  If you develop bulging veins (varicose veins) in your legs: ? Wear support hose as told by your health care provider. ? Elevate your feet for 15 minutes, 3-4 times a day. ? Limit salt in your diet. Safety  Wear your seat belt at all times when driving or riding in a car.  Talk with your health care provider if someone is verbally or physically abusive to you.  Talk with your health care provider if you are feeling sad or have thoughts of hurting yourself. Lifestyle  Do not use hot tubs, steam rooms, or saunas.  Do not douche. Do not use tampons or scented sanitary pads.  Do not use herbal remedies, alcohol,  illegal drugs, or medicines that are not approved by your health care provider. Chemicals in these products can harm your baby.  Do not use any products that contain nicotine or tobacco, such as cigarettes, e-cigarettes, and chewing tobacco. If you need help quitting, ask your health care provider.  Avoid cat litter boxes and soil used by cats. These carry germs that can cause birth defects in the baby and possibly loss of the unborn baby (fetus) by miscarriage or stillbirth. General instructions  During routine prenatal visits in the first trimester, your health care provider will do a physical exam, perform necessary tests, and ask you how things are going. Keep all follow-up visits. This is important.  Ask for help if you have counseling or nutritional needs during pregnancy. Your health care provider can offer advice or refer you to specialists for help with various needs.  Schedule a dentist appointment. At home, brush your teeth with a soft toothbrush. Floss gently.  Write down your questions. Take them to your prenatal visits. Where to find more information  American Pregnancy Association: americanpregnancy.[place] and Gynecologists: PoolDevices.com.pt  Office on [employer] [facility]:  KeywordPortfolios.com.br Contact a health care provider if you have:  Dizziness.  A fever.  Mild pelvic cramps, pelvic pressure, or nagging pain in the abdominal area.  Nausea, vomiting, or diarrhea that lasts for 24 hours or longer.  A bad-smelling vaginal discharge.  Pain when you urinate.  Known exposure to a contagious illness, such as chickenpox, measles, Zika virus, HIV, or hepatitis. Get help right away if you have:  Spotting or bleeding from your vagina.  Severe abdominal cramping or pain.  Shortness of breath or chest pain.  Any kind of trauma, such as from a fall or a car crash.  New or increased pain, swelling, or redness in an arm or leg. Summary  The first trimester of pregnancy starts on the first day of your last menstrual period until the end of week 12 (months 1 through 3).  Eating 4 or 5 small meals a day rather than 3 large meals may help to relieve nausea and vomiting.  Do not use any products that contain nicotine or tobacco, such as cigarettes, e-cigarettes, and chewing tobacco. If you need help quitting, ask your health care provider.  Keep all follow-up visits. This is important. This information is not intended to replace advice given to you by your health care provider. Make sure you discuss any questions you have with your health care provider. Document Revised: 05/14/2020 Document Reviewed: 03/20/2020 Elsevier Patient Education  2021 [place] of Pregnancy  The second trimester of pregnancy is from week 13 through week 27. This is months 4 through 6 of pregnancy. The second trimester is often a time when you feel your best. Your body has adjusted to being pregnant, and you begin to feel better physically. During the second trimester:  Morning sickness has lessened or stopped completely.  You may have more energy.  You may have an increase in appetite. The second trimester is also a time when the unborn  baby (fetus) is growing rapidly. At the end of the sixth month, the fetus may be up to 12 inches long and weigh about 1 pounds. You will likely begin to feel the baby move (quickening) between 16 and 20 weeks of pregnancy. Body changes during your second trimester Your body continues to go through many changes during your second  trimester. The changes vary and generally return to normal after the baby is born. Physical changes  Your weight will continue to increase. You will notice your lower abdomen bulging out.  You may begin to get stretch marks on your hips, abdomen, and breasts.  Your breasts will continue to grow and to become tender.  Dark spots or blotches (chloasma or mask of pregnancy) may develop on your face.  A dark line from your belly button to the pubic area (linea nigra) may appear.  You may have changes in your hair. These can include thickening of your hair, rapid growth, and changes in texture. Some people also have hair loss during or after pregnancy, or hair that feels dry or thin. Health changes  You may develop headaches.  You may have heartburn.  You may develop constipation.  You may develop hemorrhoids or swollen, bulging veins (varicose veins).  Your gums may bleed and may be sensitive to brushing and flossing.  You may urinate more often because the fetus is pressing on your bladder.  You may have back pain. This is caused by: ? Weight gain. ? Pregnancy hormones that are relaxing the joints in your pelvis. ? A shift in weight and the muscles that support your balance. Follow these instructions at home: Medicines  Follow your health care provider's instructions regarding medicine use. Specific medicines may be either safe or unsafe to take during pregnancy. Do not take any medicines unless approved by your health care provider.  Take a prenatal vitamin that contains at least 600 micrograms (mcg) of folic acid. Eating and drinking  Eat a healthy  diet that includes fresh fruits and vegetables, whole grains, good sources of protein such as meat, eggs, or tofu, and low-fat dairy products.  Avoid raw meat and unpasteurized juice, milk, and cheese. These carry germs that can harm you and your baby.  You may need to take these actions to prevent or treat constipation: ? Drink enough fluid to keep your urine pale yellow. ? Eat foods that are high in fiber, such as beans, whole grains, and fresh fruits and vegetables. ? Limit foods that are high in fat and processed sugars, such as fried or sweet foods. Activity  Exercise only as directed by your health care provider. Most people can continue their usual exercise routine during pregnancy. Try to exercise for 30 minutes at least 5 days a week. Stop exercising if you develop contractions in your uterus.  Stop exercising if you develop pain or cramping in the lower abdomen or lower back.  Avoid exercising if it is very hot or humid or if you are at a high altitude.  Avoid heavy lifting.  If you choose to, you may have sex unless your health care provider tells you not to. Relieving pain and discomfort  Wear a supportive bra to prevent discomfort from breast tenderness.  Take warm sitz baths to soothe any pain or discomfort caused by hemorrhoids. Use hemorrhoid cream if your health care provider approves.  Rest with your legs raised (elevated) if you have leg cramps or low back pain.  If you develop varicose veins: ? Wear support hose as told by your health care provider. ? Elevate your feet for 15 minutes, 3-4 times a day. ? Limit salt in your diet. Safety  Wear your seat belt at all times when driving or riding in a car.  Talk with your health care provider if someone is verbally or physically abusive to you. Lifestyle  Do not use hot tubs, steam rooms, or saunas.  Do not douche. Do not use tampons or scented sanitary pads.  Avoid cat litter boxes and soil used by cats. These  carry germs that can cause birth defects in the baby and possibly loss of the fetus by miscarriage or stillbirth.  Do not use herbal remedies, alcohol, illegal drugs, or medicines that are not approved by your health care provider. Chemicals in these products can harm your baby.  Do not use any products that contain nicotine or tobacco, such as cigarettes, e-cigarettes, and chewing tobacco. If you need help quitting, ask your health care provider. General instructions  During a routine prenatal visit, your health care provider will do a physical exam and other tests. He or she will also discuss your overall health. Keep all follow-up visits. This is important.  Ask your health care provider for a referral to a local prenatal education class.  Ask for help if you have counseling or nutritional needs during pregnancy. Your health care provider can offer advice or refer you to specialists for help with various needs. Where to find more information  American Pregnancy Association: americanpregnancy.[place] and Gynecologists: PoolDevices.com.pt  Office on [employer] [facility]: KeywordPortfolios.com.br Contact a health care provider if you have:  A headache that does not go away when you take medicine.  Vision changes or you see spots in front of your eyes.  Mild pelvic cramps, pelvic pressure, or nagging pain in the abdominal area.  Persistent nausea, vomiting, or diarrhea.  A bad-smelling vaginal discharge or foul-smelling urine.  Pain when you urinate.  Sudden or extreme swelling of your face, hands, ankles, feet, or legs.  A fever. Get help right away if you:  Have fluid leaking from your vagina.  Have spotting or bleeding from your vagina.  Have severe abdominal cramping or pain.  Have difficulty breathing.  Have chest pain.  Have fainting spells.  Have not felt your baby move for the time period told by your health care  provider.  Have new or increased pain, swelling, or redness in an arm or leg. Summary  The second trimester of pregnancy is from week 13 through week 27 (months 4 through 6).  Do not use herbal remedies, alcohol, illegal drugs, or medicines that are not approved by your health care provider. Chemicals in these products can harm your baby.  Exercise only as directed by your health care provider. Most people can continue their usual exercise routine during pregnancy.  Keep all follow-up visits. This is important. This information is not intended to replace advice given to you by your health care provider. Make sure you discuss any questions you have with your health care provider. Document Revised: 05/14/2020 Document Reviewed: 03/20/2020 Elsevier Patient Education  2021 [place].        Round Ligament Pain  The round ligament is a cord of muscle and tissue that helps support the uterus. It can become a source of pain during pregnancy if it becomes stretched or twisted as the baby grows. The pain usually begins in the second trimester (13-28 weeks) of pregnancy, and it can come and go until the baby is delivered. It is not a serious problem, and it does not cause harm to the baby. Round ligament pain is usually a short, sharp, and pinching pain, but it can also be a dull, lingering, and aching pain. The pain is felt in the lower side of the abdomen or in  the groin. It usually starts deep in the groin and moves up to the outside of the hip area. The pain may occur when you:  Suddenly change position, such as quickly going from a sitting to standing position.  Roll over in bed.  Cough or sneeze.  Do physical activity. Follow these instructions at home:  Watch your condition for any changes.  When the pain starts, relax. Then try any of these methods to help with the pain: ? Sitting down. ? Flexing your knees up to your abdomen. ? Lying on your side with one pillow under your  abdomen and another pillow between your legs. ? Sitting in a warm bath for 15-20 minutes or until the pain goes away.  Take over-the-counter and prescription medicines only as told by your health care provider.  Move slowly when you sit down or stand up.  Avoid long walks if they cause pain.  Stop or reduce your physical activities if they cause pain.  Keep all follow-up visits as told by your health care provider. This is important.   Contact a health care provider if:  Your pain does not go away with treatment.  You feel pain in your back that you did not have before.  Your medicine is not helping. Get help right away if:  You have a fever or chills.  You develop uterine contractions.  You have vaginal bleeding.  You have nausea or vomiting.  You have diarrhea.  You have pain when you urinate. Summary  Round ligament pain is felt in the lower abdomen or groin. It is usually a short, sharp, and pinching pain. It can also be a dull, lingering, and aching pain.  This pain usually begins in the second trimester (13-28 weeks). It occurs because the uterus is stretching with the growing baby, and it is not harmful to the baby.  You may notice the pain when you suddenly change position, when you cough or sneeze, or during physical activity.  Relaxing, flexing your knees to your abdomen, lying on one side, or taking a warm bath may help to get rid of the pain.  Get help from your health care provider if the pain does not go away or if you have vaginal bleeding, nausea, vomiting, diarrhea, or painful urination. This information is not intended to replace advice given to you by your health care provider. Make sure you discuss any questions you have with your health care provider. Document Revised: 05/24/2018 Document Reviewed: 05/24/2018 Elsevier Patient Education  2021 [place]???  You must attend a Doren Custard class at [facility]  3rd Wednesday of every month from [employer] by calling [phone] or online at [email or web address]  Bring [place] the certificate from the class  Waterbirth supplies needed for [facility]/Health Department patients:  Our practice has a Heritage manager in a Box tub at the hospital that you can borrow  You will need to purchase an accessory kit that has all needed supplies through [facility] [phone]) or online $175.00  Or you can purchase the supplies separately: o Single-use disposable tub liner for Birth Pool in a Box (REGULAR size) o New garden hose labeled "lead-free", "suitable for drinking water", o Electric drain pump to remove water (We recommend 792 gallon per hour or greater pump.)  o  "non-toxic" OR "water potable" o Garden hose to remove the dirty water o Fish net o Bathing suit top (  optional) o Long-handled mirror (optional)  [employer] sells tubs for ~ $120 if you would rather purchase your own tub.  They also sell accessories, liners.    Www.waterbirthsolutions.com for tub purchases and supplies  [employer] (www.thelaborladies.com) $275 for tub rental/set-up & take down/kit   [employer] information regarding doulas (labor support) who provide pool rentals:  [email or web address].[email or web address]   [employer] ([email or web address])  [email or web address].[email or web address].htm   Things that would prevent you from having a waterbirth:  Premature, <37wks  Previous cesarean birth  Presence of thick meconium-stained fluid  Multiple gestation (Twins, triplets, etc.)  Uncontrolled diabetes or gestational diabetes requiring medication  Hypertension  Heavy vaginal bleeding  Non-reassuring fetal heart rate  Active infection (MRSA, etc.)  If your labor has to be induced and induction method requires continuous monitoring of the baby's heart rate  Other  risks/issues identified by your obstetrical provider         Pregnancy and Influenza Influenza, also called the flu, is an infection of the lungs and airways (respiratory tract). If you are pregnant, you are more likely to catch the flu. You are also more likely to have serious illness from the flu. This is because pregnancy causes changes to your body's disease-fighting system (immune system), heart, and lungs. If you develop serious illness from the flu, this can cause problems for you and your developing baby. How do people get the flu? The flu is caused by a type of germ called a virus. It spreads when virus particles get passed from person to person by:  Being near a sick person who is coughing or sneezing.  Touching something that has the virus on it and then touching your mouth, nose, or face. The influenza virus is most common during the fall and winter. What actions can I take to protect myself against the flu?  Get a flu shot. The best way to prevent the flu is to get a flu shot before flu season starts. The flu shot is not dangerous for your developing baby. It may even help protect your baby from the flu for up to 6 months after birth.  Wash your hands often with soap and warm water for at least 20 seconds. If soap and water are not available, use alcohol-based hand sanitizer.  Do not come in close contact with sick people.  Do not share food, drinks, or utensils with other people.  Avoid touching your eyes, nose, and mouth.  Clean frequently used surfaces at home, school, or work.  Practice healthy lifestyle habits, such as: ? Eating a healthy, balanced diet. ? Drinking plenty of fluids. ? Exercising regularly or as told by your health care provider. ? Sleeping 7-9 hours each night. ? Finding ways to manage stress.   What should I do if I have flu symptoms?  If you have any symptoms of the flu, even after getting a flu shot, contact your health care provider right  away.  To reduce fever, take over-the-counter acetaminophen as told by your health care provider.  If you have the flu, your health care provider may give you antiviral medicine to keep the flu from becoming severe and to shorten how long it lasts.  Avoid spreading the flu to others: ? Stay home until you are well. ? Cover your nose and mouth when you cough or sneeze. ? Wash your hands often.   Follow these instructions at home:  Take over-the-counter and prescription medicines only as told by your health care  provider. Do not take any medicine, including cold or flu medicine, unless your health care provider tells you to do so.  If you were prescribed antiviral medicine, take it as told by your health care provider. Do not stop taking the antiviral medicine even if you start to feel better.  Eat a nutrient-rich diet that includes fresh fruits and vegetables, whole grains, lean protein, and low-fat dairy.  Drink enough fluid to keep your urine clear or pale yellow.  Get plenty of rest.  Keep all follow-up visits. This is important. Contact a health care provider if:  You have a fever or chills.  You have a cough, sore throat, or stuffy nose.  You have worsening or unusual muscle aches, headache, tiredness, or loss of appetite.  You have vomiting or diarrhea. Get help right away if:  You have trouble breathing.  You have chest pain.  You have abdominal pain.  You begin to have labor pains.  You do not feel your baby move.  You have diarrhea or vomiting that will not go away.  You have dizziness or confusion.  Your symptoms do not improve, even with treatment. These symptoms may represent a serious problem that is an emergency. Do not wait to see if the symptoms will go away. Get medical help right away. Call your local emergency services (911 in the [place] Do not drive yourself to the hospital. Summary  If you are pregnant, you are more likely to catch the flu. You  are also more likely to have a more serious case of the flu.  If you have flu-like symptoms, call your health care provider right away. If you develop serious illness from the flu, this can cause problems for you and your developing baby.  The best way to prevent the flu is to get a flu shot before flu season starts. The flu shot is safe during pregnancy and not dangerous for your developing baby.  If you have the flu and were prescribed antiviral medicine, take it as told by your health care provider. This information is not intended to replace advice given to you by your health care provider. Make sure you discuss any questions you have with your health care provider. Document Revised: 07/26/2020 Document Reviewed: 07/26/2020 Elsevier Patient Education  2021 [employer].

## 2021-01-28 NOTE — Progress Notes (Signed)
[b    vggggggg    History:   Rachel Heath is a 30 y.o. D5H2992 at 50w3dby LMP being seen today for her first obstetrical visit.  Her obstetrical history is significant for RH negative. Patient does intend to breast feed. Pregnancy history fully reviewed.  Pt reports this is a desired and planned pregnancy. Allergies: NKDA Current Medications: PNVs PMH: sickle cell trait. No HTN, DM, asthma. PSH: hand surgery for cyst (right hand) OB Hx: 2011 (TAB, weeks unknown, surgical), 2014 (SAB, 7wks), 2014 (full-term, NSVD, no complications), 24268(full-term, NSVD, no complications), 23419(full-term, NSVD, no complications) Social Hx: pt does not smoke, drink, or use drugs. Family Hx: none Pt declined flu vaccine.  Patient reports no complaints.      HISTORY: OB History  Gravida Para Term Preterm AB Living  '6 3 3 ' 0 2 3  SAB IAB Ectopic Multiple Live Births  1 1 0 0 3    # Outcome Date GA Lbr Len/2nd Weight Sex Delivery Anes PTL Lv  6 Current           5 Term 11/04/18 320w2d4:02 / 00:02 6 lb 4 oz (2.835 kg) F Vag-Spont None  LIV     Name: JOYARENIS, CERINO   Apgar1: 9  Apgar5: 9  4 Term 03/18/17 3920w2d00:57 7 lb 9 oz (3.43 kg) M Vag-Spont EPI  LIV     Birth Comments: WNL      Name: JONKatha Cabal  Apgar1: 8  Apgar5: 9  3 Term 04/19/14 39w81w0d59 / 02:18 6 lb 8 oz (2.948 kg) M Vag-Spont EPI  LIV     Name: Lineberry,BOY Jhayla     Apgar1: 9  Apgar5: 9  2 IAB           1 SAB             Last pap smear was done 09/2018 and was normal  Past Medical History:  Diagnosis Date  . Ganglion cyst    right hand  . Heart murmur    per parent  . [facility] Past Surgical History:  Procedure Laterality Date  . HAND SURGERY     History reviewed. No pertinent family history. Social History   Tobacco Use  . Smoking status: Former Smoker    Types: Cigars    Quit date: 05/19/2016    Years since quitting: 4.6  . Smokeless tobacco: Never Used  Vaping Use  .  Vaping Use: Never used  Substance Use Topics  . Alcohol use: No  . Drug use: No    Frequency: 3.0 times per week   No Known Allergies Current Outpatient Medications on File Prior to Visit  Medication Sig Dispense Refill  . Blood Pressure Monitor KIT 1 Device by Does not apply route once a week. To be monitored Regularly at home. 1 kit 0  . Prenatal Vit-Fe Fumarate-FA (PRENATAL PO) Take by mouth.    . ferrous sulfate 325 (65 FE) MG tablet Take 1 tablet (325 mg total) by mouth 2 (two) times daily with a meal. 60 tablet 3  . ibuprofen (ADVIL,MOTRIN) 600 MG tablet Take 1 tablet (600 mg total) by mouth every 6 (six) hours. 60 tablet 1  . mupirocin ointment (BACTROBAN) 2 % Apply 1 application topically 3 (three) times daily. 22 g 1  . Prenatal Vit w/Fe-Methylfol-FA (PNV PO) Take by mouth.    . senna-docusate (SENOKOT-S) 8.6-50 MG  tablet Take 2 tablets by mouth daily. 20 tablet 0   No current facility-administered medications on file prior to visit.    Review of Systems Pertinent items noted in HPI and remainder of comprehensive ROS otherwise negative.  Physical Exam:   Vitals:   01/28/21 0925  BP: 120/73  Pulse: 80  Weight: 206 lb (93.4 kg)   Fetal Heart Rate (bpm): 168  General: well-developed, well-nourished female in no acute distress  Breasts:  pt declines  Skin: normal coloration and turgor, no rashes  Neurologic: oriented, normal, negative, normal mood  Extremities: normal strength, tone, and muscle mass, ROM of all joints is normal  HEENT PERRLA, extraocular movement intact and sclera clear, anicteric  Neck supple and no masses  Cardiovascular: regular rate and rhythm  Respiratory:  no respiratory distress, normal breath sounds  Abdomen: soft, non-tender; bowel sounds normal; no masses,  no organomegaly  Pelvic: normal external genitalia, no lesions, normal vaginal mucosa, normal vaginal discharge, normal cervix, pap smear done. Uterine size:      Assessment:     Pregnancy: [id number] Patient Active Problem List   Diagnosis Date Noted  . Supervision of normal pregnancy, antepartum 12/17/2020  . Heart murmur   . Family history of sickle cell trait   . Sickle cell trait ([place]) 10/25/2016  . Rh negative state in antepartum period 10/25/2016  . Low vitamin D level 10/04/2016     Plan:    1. Supervision of other normal pregnancy, antepartum - CBC/D/Plt+RPR+Rh+ABO+Rub Ab... - Cervicovaginal ancillary only - Genetic Screening - Culture, OB Urine - Cytology - PAP( [place]) - WB information given  2. Heart murmur - AMB referral to cardiology - pt reports is not a problem, sometimes it gets "louder" has not seen cardiologist in several years - not heard on today's exam  3. Low vitamin D level - Vitamin D 1,25 dihydroxy   Initial labs drawn. Continue prenatal vitamins. Problem list reviewed and updated. Genetic Screening discussed, NIPS: requested. Ultrasound discussed; fetal anatomic survey: ordered. Anticipatory guidance about prenatal visits given including labs, ultrasounds, and testing. Discussed usage of Babyscripts and virtual visits as additional source of managing and completing prenatal visits in midst of coronavirus and pandemic.   Encouraged to complete MyChart Registration for her ability to review results, send requests, and have questions addressed.  The nature of [place] for [facility] with multiple MDs and Advanced Practice Providers was explained to patient; also emphasized that residents, students are part of our team. Routine obstetric precautions reviewed. Encouraged to seek out care at office or emergency room [facility] preferred) for urgent and/or emergent concerns. Return in about 4 weeks (around 02/25/2021) for in-person LOB/MIDWIFE ONLY/BP cuff check/AFP.    Clarisa Fling, NP  10:41 AM 01/28/2021

## 2021-01-28 NOTE — Progress Notes (Signed)
Pt presents for NOB visit  Pt interested in WB NOB intake completed 12/17/20

## 2021-01-28 NOTE — Addendum Note (Signed)
Addended by: Marylen Ponto on: 01/28/2021 10:43 AM   Modules accepted: Orders

## 2021-01-29 LAB — CERVICOVAGINAL ANCILLARY ONLY
Chlamydia: NEGATIVE
Comment: NEGATIVE
Comment: NEGATIVE
Comment: NORMAL
Neisseria Gonorrhea: NEGATIVE
Trichomonas: NEGATIVE

## 2021-01-29 LAB — CYTOLOGY - PAP: Diagnosis: NEGATIVE

## 2021-01-31 ENCOUNTER — Other Ambulatory Visit: Payer: Self-pay | Admitting: Women's Health

## 2021-01-31 DIAGNOSIS — O2341 Unspecified infection of urinary tract in pregnancy, first trimester: Secondary | ICD-10-CM | POA: Insufficient documentation

## 2021-01-31 LAB — URINE CULTURE, OB REFLEX

## 2021-01-31 LAB — CULTURE, OB URINE

## 2021-01-31 MED ORDER — CEFADROXIL 500 MG PO CAPS: 500.0000 mg | ORAL_CAPSULE | Freq: Two times a day (BID) | ORAL | 0 refills | Status: AC

## 2021-01-31 NOTE — Progress Notes (Signed)
RX cefadroxil sent.  Marylen Ponto, NP  8:17 PM 01/31/2021

## 2021-02-05 ENCOUNTER — Encounter: Payer: Self-pay | Admitting: Women's Health

## 2021-02-05 DIAGNOSIS — Z3480 Encounter for supervision of other normal pregnancy, unspecified trimester: Secondary | ICD-10-CM

## 2021-02-06 LAB — CBC/D/PLT+RPR+RH+ABO+RUB AB...
Antibody Screen: NEGATIVE
Basophils Absolute: 0 10*3/uL (ref 0.0–0.2)
Basos: 0 %
EOS (ABSOLUTE): 0.1 10*3/uL (ref 0.0–0.4)
Eos: 1 %
HCV Ab: <0.1 s/co ratio (ref 0.0–0.9)
HIV Screen 4th Generation wRfx: NONREACTIVE
Hematocrit: 34.8 % (ref 34.0–46.6)
Hemoglobin: 11.7 g/dL (ref 11.1–15.9)
Hepatitis B Surface Ag: NEGATIVE
Immature Grans (Abs): 0 10*3/uL (ref 0.0–0.1)
Immature Granulocytes: 0 %
Lymphocytes Absolute: 1.7 10*3/uL (ref 0.7–3.1)
Lymphs: 26 %
MCH: 28.2 pg (ref 26.6–33.0)
MCHC: 33.6 g/dL (ref 31.5–35.7)
MCV: 84 fL (ref 79–97)
Monocytes Absolute: 0.6 10*3/uL (ref 0.1–0.9)
Monocytes: 8 %
Neutrophils Absolute: 4.2 10*3/uL (ref 1.4–7.0)
Neutrophils: 65 %
Platelets: 372 10*3/uL (ref 150–450)
RBC: 4.15 x10E6/uL (ref 3.77–5.28)
RDW: 13.1 % (ref 11.7–15.4)
RPR Ser Ql: NONREACTIVE
Rh Factor: NEGATIVE
Rubella Antibodies, IGG: 3.28 index (ref >0.99)
WBC: 6.5 10*3/uL (ref 3.4–10.8)

## 2021-02-06 LAB — VITAMIN D 1,25 DIHYDROXY
Vitamin D 1, 25 (OH)2 Total: 124 pg/mL — ABNORMAL HIGH
Vitamin D2 1, 25 (OH)2: <10 pg/mL
Vitamin D3 1, 25 (OH)2: 124 pg/mL

## 2021-02-06 LAB — HEMOGLOBIN A1C
Est. average glucose Bld gHb Est-mCnc: 105 mg/dL
Hgb A1c MFr Bld: 5.3 % (ref 4.8–5.6)

## 2021-02-06 LAB — HCV INTERPRETATION

## 2021-02-10 ENCOUNTER — Encounter: Payer: Self-pay | Admitting: Women's Health

## 2021-02-10 NOTE — Progress Notes (Signed)
Please call patient to inquire if she is taking a Vitamin D supplement. Patient had elevated Vitamin D on recent labs. Please let me know her response.  Thank you! Joni Reining

## 2021-02-16 ENCOUNTER — Encounter: Payer: Self-pay | Admitting: Women's Health

## 2021-02-16 DIAGNOSIS — R7989 Other specified abnormal findings of blood chemistry: Secondary | ICD-10-CM | POA: Insufficient documentation

## 2021-02-25 ENCOUNTER — Telehealth: Payer: Self-pay

## 2021-02-25 NOTE — Telephone Encounter (Signed)
Pt called reports intermittent BTL abdominal pain and dizziness. 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 Pt reports normal BP readings  Informed pt to drink 64-96 oz water daily  Pt may take (2) Extra Strength Tylenol as directed

## 2021-02-26 ENCOUNTER — Ambulatory Visit (INDEPENDENT_AMBULATORY_CARE_PROVIDER_SITE_OTHER): Payer: Medicaid Other | Admitting: Certified Nurse Midwife

## 2021-02-26 ENCOUNTER — Other Ambulatory Visit: Payer: Self-pay

## 2021-02-26 VITALS — BP 107/72 | HR 82 | Wt 211.0 lb

## 2021-02-26 DIAGNOSIS — Z3A15 15 weeks gestation of pregnancy: Secondary | ICD-10-CM

## 2021-02-26 DIAGNOSIS — Z3480 Encounter for supervision of other normal pregnancy, unspecified trimester: Secondary | ICD-10-CM

## 2021-02-26 DIAGNOSIS — Z6791 Unspecified blood type, Rh negative: Secondary | ICD-10-CM

## 2021-02-26 DIAGNOSIS — O26899 Other specified pregnancy related conditions, unspecified trimester: Secondary | ICD-10-CM

## 2021-02-26 DIAGNOSIS — O2341 Unspecified infection of urinary tract in pregnancy, first trimester: Secondary | ICD-10-CM

## 2021-02-26 NOTE — Patient Instructions (Signed)
Alpha-Fetoprotein Test Why am I having this test? The alpha-fetoprotein test is a lab test most commonly used for pregnant women to help screen for birth defects in their unborn baby. It can be used to screen for chromosome (DNA) abnormalities, problems with the brain or spinal cord, or problems with the abdominal wall of the unborn baby (fetus). The alpha-fetoprotein test may also be done for men or nonpregnant women to check for certain cancers. What is being tested? This test measures the amount of alpha-fetoprotein (AFP) in your blood. AFP is a protein that is made by the liver. Levels can be detected in the mother's blood during pregnancy, starting at 10 weeks and peaking at 16-18 weeks of the pregnancy. Abnormal levels can sometimes be a sign of a birth defect in the baby. Certain cancers can cause a high level of AFP in men and nonpregnant women. What kind of sample is taken? A blood sample is required for this test. It is usually collected by inserting a needle into a blood vessel.   How are the results reported? Your test results will be reported as values. Your health care provider will compare your results to normal ranges that were established after testing a large group of people (reference values). Reference values may vary among labs and hospitals. For this test, common reference values are:  Adult: Less than 40 ng/mL or less than 40 mcg/L (SI units).  Child younger than 1 year: Less than 30 ng/mL. If you are pregnant, the values may also vary based on how long you have been pregnant. What do the results mean? Results that are above the reference values in pregnant women may indicate the following for the baby:  Neural tube defects, such as abnormalities of the spinal cord or brain.  Abdominal wall defects.  Multiple pregnancy such as twins.  Fetal distress or fetal death. Results that are above the reference values in men or nonpregnant women may indicate:  Reproductive  cancers, such as ovarian or testicular cancer.  Liver cancer.  Liver cell death.  Other types of cancer. Very low levels of AFP in pregnant women may indicate Down syndrome for the baby. Talk with your health care provider about what your results mean. Questions to ask your health care provider Ask your health care provider, or the department that is doing the test:  When will my results be ready?  How will I get my results?  What are my treatment options?  What other tests do I need?  What are my next steps? Summary  The alpha-fetoprotein test is done on pregnant women to help screen for birth defects in their unborn baby.  Certain cancers can cause a high level of AFP in men and nonpregnant women.  For this test, a blood sample is usually collected by inserting a needle into a blood vessel.  Talk with your health care provider about what your results mean. This information is not intended to replace advice given to you by your health care provider. Make sure you discuss any questions you have with your health care provider. Document Revised: 06/27/2020 Document Reviewed: 06/27/2020 Elsevier Patient Education  2021 Elsevier Inc.  

## 2021-02-26 NOTE — Progress Notes (Signed)
Pt reports feeling fetal movement, reports some occasional sharp pains on sides of abdomen. Pt also reports occasional dizziness with position changes.

## 2021-02-26 NOTE — Progress Notes (Signed)
   PRENATAL VISIT NOTE  Subjective:  Rachel Heath is a 30 y.o. [id number] at [redacted]w[redacted]d being seen today for ongoing prenatal care.  She is currently monitored for the following issues for this high-risk pregnancy and has Sickle cell trait (HCC); Rh negative state in antepartum period; Heart murmur; Family history of sickle cell trait; Supervision of normal pregnancy, antepartum; Obesity in pregnancy; Urinary tract infection in pregnancy, antepartum, first trimester; and High serum vitamin D on their problem list.  Patient reports sharp abdominal pain with movement and occasional dizziness.  Contractions: Not present. Vag. Bleeding: None.  Movement: Present. Denies leaking of fluid.   The following portions of the patient's history were reviewed and updated as appropriate: allergies, current medications, past family history, past medical history, past social history, past surgical history and problem list.   Objective:   Vitals:   02/26/21 1511  BP: 107/72  Pulse: 82  Weight: 211 lb (95.7 kg)    Fetal Status: Fetal Heart Rate (bpm): 154   Movement: Present     General:  Alert, oriented and cooperative. Patient is in no acute distress.  Skin: Skin is warm and dry. No rash noted.   Cardiovascular: Normal heart rate noted  Respiratory: Normal respiratory effort, no problems with respiration noted  Abdomen: Soft, gravid, appropriate for gestational age.  Pain/Pressure: Present     Pelvic: Cervical exam deferred        Extremities: Normal range of motion.  Edema: None  Mental Status: Normal mood and affect. Normal behavior. Normal judgment and thought content.   Assessment and Plan:  Pregnancy: [id number] at [redacted]w[redacted]d 1. Supervision of other normal pregnancy, antepartum - Patient reports sharp abdominal pain with movement and occasional dizziness on separate occasions  - Educated and discussed with patient RLP. Patient reports that the pain occurs intermittently and always when she is moving.  Encouraged use of maternity support belt while at work and moving around  - Patient reports dizziness while at work. Reviewed diet with patient - patient reports only eating once a day sometimes two. Educated and discussed with patient about increasing amount she eats and increasing protein, encouraged patient to get protein snack packs so that she can eat while at work or premier protein shakes.  - Routine prenatal care - Anticipatory guidance on upcoming appointments with next being AFP   2. Urinary tract infection in pregnancy, antepartum, first trimester - TOC completed today  - Culture, OB Urine  3. [redacted] weeks gestation of pregnancy - Patient has anatomy US scheduled for 4/4  4. Rh negative state in antepartum period - Rhogam at 28 weeks   Preterm labor symptoms and general obstetric precautions including but not limited to vaginal bleeding, contractions, leaking of fluid and fetal movement were reviewed in detail with the patient. Please refer to After Visit Summary for other counseling recommendations.   Return in about 4 weeks (around 03/26/2021) for LROB, AFP, in person.  Future Appointments  Date Time Provider Department Center  03/03/2021 10:40 AM Little Ishikawa, MD [facility]  03/23/2021 10:00 AM [facility] NURSE [facility]  03/23/2021 10:15 AM [facility] US2 [facility]  03/26/2021  3:40 PM Rasch, Harolyn Rutherford, NP [facility] None    Sharyon Cable, CNM

## 2021-02-28 LAB — CULTURE, OB URINE

## 2021-02-28 LAB — URINE CULTURE, OB REFLEX

## 2021-03-02 NOTE — Progress Notes (Signed)
Cardiology Office Note:    Date:  03/03/2021   ID:  Collie, Kittel [birth year], MRN [id number]  PCP:  Patient, No Pcp Per  Cardiologist:  No primary care provider on file.  Electrophysiologist:  None   Referring MD: Marylen Ponto, NP   Chief Complaint  Patient presents with  . Heart Murmur    History of Present Illness:    Rachel Heath is a 30 y.o. female with a hx of sickle cell trait who is referred by Donia Ast, NP for evaluation of heart murmur.  She is [redacted] weeks pregnant.  She reports that she has been having dyspnea with exertion, denies any chest pain.  Also has been having some dizziness but denies any syncope.  Denies any palpitations at rest but reports does feel like her heart races with exertion.  Denies any lower extremity edema.  Former smoker.  No history of heart disease in her immediate family.  PGF had MI and multiple strokes.   Normal echo in 2014.    Past Medical History:  Diagnosis Date  . Ganglion cyst    right hand  . Heart murmur    per parent  . Sickle cell trait [facility])     Past Surgical History:  Procedure Laterality Date  . HAND SURGERY      Current Medications: Current Meds  Medication Sig  . ferrous sulfate 325 (65 FE) MG tablet Take 1 tablet (325 mg total) by mouth 2 (two) times daily with a meal.  . Prenatal Vit w/Fe-Methylfol-FA (PNV PO) Take by mouth.  . Prenatal Vit-Fe Fumarate-FA (PRENATAL PO) Take by mouth.     Allergies:   Patient has no known allergies.   Social History   Socioeconomic History  . Marital status: Single    Spouse name: Not on file  . Number of children: Not on file  . Years of education: Not on file  . Highest education level: Not on file  Occupational History  . Not on file  Tobacco Use  . Smoking status: Former Smoker    Types: Cigars    Quit date: 05/19/2016    Years since quitting: 4.7  . Smokeless tobacco: Never Used  Vaping Use  . Vaping Use: Never used  Substance and Sexual  Activity  . Alcohol use: No  . Drug use: No    Frequency: 3.0 times per week  . Sexual activity: Yes    Birth control/protection: None    Comment: Pregnant   Other Topics Concern  . Not on file  Social History Narrative  . Not on file   Social Determinants of Health   Financial Resource Strain: Not on file  Food Insecurity: Not on file  Transportation Needs: Not on file  Physical Activity: Not on file  Stress: Not on file  Social Connections: Not on file     Family History: The patient's family history is not on file.  ROS:   Please see the history of present illness.     All other systems reviewed and are negative.  EKGs/Labs/Other Studies Reviewed:    The following studies were reviewed today:   EKG:  EKG is  ordered today.  The ekg ordered today demonstrates normal sinus rhythm, rate 80, no ST abnormalities  Recent Labs: 01/28/2021: Hemoglobin 11.7; Platelets 372  Recent Lipid Panel No results found for: CHOL, TRIG, HDL, CHOLHDL, VLDL, LDLCALC, LDLDIRECT  Physical Exam:    VS:  BP 122/68   Pulse 80  Ht 5\' 7"  (1.702 m)   Wt 210 lb 6.4 oz (95.4 kg)   LMP 11/09/2020   SpO2 98%   BMI 32.95 kg/m     Wt Readings from Last 3 Encounters:  03/03/21 210 lb 6.4 oz (95.4 kg)  02/26/21 211 lb (95.7 kg)  01/28/21 206 lb (93.4 kg)     GEN: Well nourished, well developed in no acute distress HEENT: Normal NECK: No JVD; No carotid bruits LYMPHATICS: No lymphadenopathy CARDIAC: RRR, 2 out of 6 systolic murmur RESPIRATORY:  Clear to auscultation without rales, wheezing or rhonchi  ABDOMEN: Soft, non-tender, non-distended MUSCULOSKELETAL:  No edema; No deformity  SKIN: Warm and dry NEUROLOGIC:  Alert and oriented x 3 PSYCHIATRIC:  Normal affect   ASSESSMENT:    1. Heart murmur   2. DOE (dyspnea on exertion)   3. Lightheadedness    PLAN:    Heart murmur: 2/6 systolic heart murmur.  Suspect benign flow murmur.  Previously normal echo 2014.  Does report has  been having some dyspnea on exertion and lightheadedness.  Will check echo  RTC as needed  Medication Adjustments/Labs and Tests Ordered: Current medicines are reviewed at length with the patient today.  Concerns regarding medicines are outlined above.  Orders Placed This Encounter  Procedures  . EKG 12-Lead  . ECHOCARDIOGRAM COMPLETE   No orders of the defined types were placed in this encounter.   Patient Instructions  Medication Instructions:  Your physician recommends that you continue on your current medications as directed. Please refer to the Current Medication list given to you today.  Testing/Procedures: Your physician has requested that you have an echocardiogram. Echocardiography is a painless test that uses sound waves to create images of your heart. It provides your doctor with information about the size and shape of your heart and how well your heart's chambers and valves are working. This procedure takes approximately one hour. There are no restrictions for this procedure.  This will be done at our [facility] location:  [facility] [address]  Follow-Up: At [employer], you and your health needs are our priority.  As part of our continuing mission to provide you with exceptional heart care, we have created designated Provider Care Teams.  These Care Teams include your primary Cardiologist (physician) and Advanced Practice Providers (APPs -  Physician Assistants and Nurse Practitioners) who all work together to provide you with the care you need, when you need it.  We recommend signing up for the patient portal called "MyChart".  Sign up information is provided on this After Visit Summary.  MyChart is used to connect with patients for Virtual Visits (Telemedicine).  Patients are able to view lab/test results, encounter notes, upcoming appointments, etc.  Non-urgent messages can be sent to your provider as well.   To learn more about what you can do with MyChart,  go to [employer].    Your next appointment:   AS NEEDED with Dr. [email or web address]     Signed, Bjorn Pippin, MD  03/03/2021 1:18 PM    [facility]

## 2021-03-03 ENCOUNTER — Ambulatory Visit (INDEPENDENT_AMBULATORY_CARE_PROVIDER_SITE_OTHER): Payer: Medicaid Other | Admitting: Cardiology

## 2021-03-03 ENCOUNTER — Encounter: Payer: Self-pay | Admitting: Cardiology

## 2021-03-03 ENCOUNTER — Other Ambulatory Visit: Payer: Self-pay

## 2021-03-03 VITALS — BP 122/68 | HR 80 | Ht 67.0 in | Wt 210.4 lb

## 2021-03-03 DIAGNOSIS — R011 Cardiac murmur, unspecified: Secondary | ICD-10-CM | POA: Diagnosis not present

## 2021-03-03 DIAGNOSIS — R0600 Dyspnea, unspecified: Secondary | ICD-10-CM

## 2021-03-03 DIAGNOSIS — R42 Dizziness and giddiness: Secondary | ICD-10-CM | POA: Diagnosis not present

## 2021-03-03 DIAGNOSIS — R0609 Other forms of dyspnea: Secondary | ICD-10-CM

## 2021-03-03 NOTE — Patient Instructions (Signed)
Medication Instructions:  Your physician recommends that you continue on your current medications as directed. Please refer to the Current Medication list given to you today.  Testing/Procedures: Your physician has requested that you have an echocardiogram. Echocardiography is a painless test that uses sound waves to create images of your heart. It provides your doctor with information about the size and shape of your heart and how well your heart's chambers and valves are working. This procedure takes approximately one hour. There are no restrictions for this procedure.  This will be done at our [address] location:  [address]  Follow-Up: At [facility], you and your health needs are our priority.  As part of our continuing mission to provide you with exceptional heart care, we have created designated Provider Care Teams.  These Care Teams include your primary Cardiologist (physician) and Advanced Practice Providers (APPs -  Physician Assistants and Nurse Practitioners) who all work together to provide you with the care you need, when you need it.  We recommend signing up for the patient portal called "MyChart".  Sign up information is provided on this After Visit Summary.  MyChart is used to connect with patients for Virtual Visits (Telemedicine).  Patients are able to view lab/test results, encounter notes, upcoming appointments, etc.  Non-urgent messages can be sent to your provider as well.   To learn more about what you can do with MyChart, go to [email or web address].    Your next appointment:   AS NEEDED with Dr. Schumann 

## 2021-03-23 ENCOUNTER — Ambulatory Visit: Payer: Medicaid Other | Attending: Women's Health

## 2021-03-23 ENCOUNTER — Ambulatory Visit: Payer: Medicaid Other | Admitting: *Deleted

## 2021-03-23 ENCOUNTER — Encounter: Payer: Self-pay | Admitting: *Deleted

## 2021-03-23 ENCOUNTER — Other Ambulatory Visit: Payer: Self-pay

## 2021-03-23 DIAGNOSIS — R7989 Other specified abnormal findings of blood chemistry: Secondary | ICD-10-CM

## 2021-03-23 DIAGNOSIS — R011 Cardiac murmur, unspecified: Secondary | ICD-10-CM | POA: Insufficient documentation

## 2021-03-23 DIAGNOSIS — O99210 Obesity complicating pregnancy, unspecified trimester: Secondary | ICD-10-CM | POA: Insufficient documentation

## 2021-03-23 DIAGNOSIS — Z3480 Encounter for supervision of other normal pregnancy, unspecified trimester: Secondary | ICD-10-CM | POA: Insufficient documentation

## 2021-03-26 ENCOUNTER — Ambulatory Visit (INDEPENDENT_AMBULATORY_CARE_PROVIDER_SITE_OTHER): Payer: Medicaid Other | Admitting: Obstetrics and Gynecology

## 2021-03-26 ENCOUNTER — Other Ambulatory Visit: Payer: Self-pay

## 2021-03-26 VITALS — BP 119/78 | HR 89 | Wt 211.0 lb

## 2021-03-26 DIAGNOSIS — Z3482 Encounter for supervision of other normal pregnancy, second trimester: Secondary | ICD-10-CM

## 2021-03-26 DIAGNOSIS — Z3A19 19 weeks gestation of pregnancy: Secondary | ICD-10-CM

## 2021-03-26 DIAGNOSIS — Z3480 Encounter for supervision of other normal pregnancy, unspecified trimester: Secondary | ICD-10-CM

## 2021-03-26 NOTE — Progress Notes (Signed)
   PRENATAL VISIT NOTE  Subjective:  Rachel Heath is a 30 y.o. [id number] at [redacted]w[redacted]d being seen today for ongoing prenatal care.  She is currently monitored for the following issues for this low-risk pregnancy and has Sickle cell trait (HCC); Rh negative state in antepartum period; Heart murmur; Family history of sickle cell trait; Supervision of normal pregnancy, antepartum; Obesity in pregnancy; Urinary tract infection in pregnancy, antepartum, first trimester; and High serum vitamin D on their problem list.  Patient reports no complaints.  Contractions: Not present. Vag. Bleeding: None.  Movement: Present. Denies leaking of fluid.   The following portions of the patient's history were reviewed and updated as appropriate: allergies, current medications, past family history, past medical history, past social history, past surgical history and problem list.   Objective:   Vitals:   03/26/21 1541  BP: 119/78  Pulse: 89  Weight: 211 lb (95.7 kg)    Fetal Status: Fetal Heart Rate (bpm): 140   Movement: Present     General:  Alert, oriented and cooperative. Patient is in no acute distress.  Skin: Skin is warm and dry. No rash noted.   Cardiovascular: Normal heart rate noted  Respiratory: Normal respiratory effort, no problems with respiration noted  Abdomen: Soft, gravid, appropriate for gestational age.  Pain/Pressure: Absent     Pelvic: Cervical exam deferred        Extremities: Normal range of motion.  Edema: None  Mental Status: Normal mood and affect. Normal behavior. Normal judgment and thought content.   Assessment and Plan:  Pregnancy: [id number] at [redacted]w[redacted]d 1. Supervision of other normal pregnancy, antepartum  - AFP, Serum, Open Spina Bifida - Water birth class information given.  - cardiology visit on 3/15 recommended ECHO, do not see order. Message sent to clinical staff to schedule with [facility] heart care.   Preterm labor symptoms and general obstetric precautions including but not  limited to vaginal bleeding, contractions, leaking of fluid and fetal movement were reviewed in detail with the patient. Please refer to After Visit Summary for other counseling recommendations.   No follow-ups on file.  Future Appointments  Date Time Provider Department Center  03/30/2021  4:05 PM [facility] ECHO 5 [id number]  04/23/2021  4:00 PM Adam Phenix, MD [facility] None    Venia Carbon, NP

## 2021-03-26 NOTE — Progress Notes (Signed)
+   Fetal movement. No complaints.  

## 2021-03-28 LAB — AFP, SERUM, OPEN SPINA BIFIDA
AFP MoM: 0.51
AFP Value: 25.3 ng/mL
Gest. Age on Collection Date: 19.4 weeks
Maternal Age At EDD: 28.3 yr
OSBR Risk 1 IN: 10000
Test Results:: NEGATIVE
Weight: 211 [lb_av]

## 2021-03-30 ENCOUNTER — Ambulatory Visit (HOSPITAL_COMMUNITY): Payer: Medicaid Other | Attending: Cardiology

## 2021-03-30 ENCOUNTER — Other Ambulatory Visit: Payer: Self-pay

## 2021-03-30 DIAGNOSIS — R011 Cardiac murmur, unspecified: Secondary | ICD-10-CM | POA: Insufficient documentation

## 2021-03-30 LAB — ECHOCARDIOGRAM COMPLETE
Area-P 1/2: 3.34 cm2
S' Lateral: 2.5 cm

## 2021-04-10 ENCOUNTER — Telehealth: Payer: Self-pay | Admitting: Cardiology

## 2021-04-10 NOTE — Telephone Encounter (Signed)
Patient was returning call for her results

## 2021-04-10 NOTE — Telephone Encounter (Signed)
Patient made aware of recent Echo results.   Little Ishikawa, MD  03/30/2021 10:21 PM EDT      No significant abnormalities   Advised patient to call back to office with any issues, questions, or concerns. Patient verbalized understanding.

## 2021-04-23 ENCOUNTER — Other Ambulatory Visit: Payer: Self-pay

## 2021-04-23 ENCOUNTER — Ambulatory Visit (INDEPENDENT_AMBULATORY_CARE_PROVIDER_SITE_OTHER): Payer: Medicaid Other | Admitting: Obstetrics & Gynecology

## 2021-04-23 VITALS — BP 118/78 | HR 88 | Wt 215.4 lb

## 2021-04-23 DIAGNOSIS — Z6791 Unspecified blood type, Rh negative: Secondary | ICD-10-CM

## 2021-04-23 DIAGNOSIS — O26899 Other specified pregnancy related conditions, unspecified trimester: Secondary | ICD-10-CM

## 2021-04-23 DIAGNOSIS — Z3480 Encounter for supervision of other normal pregnancy, unspecified trimester: Secondary | ICD-10-CM

## 2021-04-23 NOTE — Progress Notes (Signed)
   PRENATAL VISIT NOTE  Subjective:  Rachel Heath is a 30 y.o. [phone] at [redacted]w[redacted]d being seen today for ongoing prenatal care.  She is currently monitored for the following issues for this low-risk pregnancy and has Sickle cell trait (HCC); Rh negative state in antepartum period; Heart murmur; Family history of sickle cell trait; Supervision of normal pregnancy, antepartum; Obesity in pregnancy; Urinary tract infection in pregnancy, antepartum, first trimester; and High serum vitamin D on their problem list.  Patient reports no complaints.  Contractions: Not present. Vag. Bleeding: None.  Movement: Present. Denies leaking of fluid.   The following portions of the patient's history were reviewed and updated as appropriate: allergies, current medications, past family history, past medical history, past social history, past surgical history and problem list.   Objective:   Vitals:   04/23/21 1555  BP: 118/78  Pulse: 88  Weight: 215 lb 6.4 oz (97.7 kg)    Fetal Status: Fetal Heart Rate (bpm): 142   Movement: Present     General:  Alert, oriented and cooperative. Patient is in no acute distress.  Skin: Skin is warm and dry. No rash noted.   Cardiovascular: Normal heart rate noted  Respiratory: Normal respiratory effort, no problems with respiration noted  Abdomen: Soft, gravid, appropriate for gestational age.  Pain/Pressure: Absent     Pelvic: Cervical exam deferred        Extremities: Normal range of motion.  Edema: None  Mental Status: Normal mood and affect. Normal behavior. Normal judgment and thought content.   Assessment and Plan:  Pregnancy: [id number] at [redacted]w[redacted]d There are no diagnoses linked to this encounter. Preterm labor symptoms and general obstetric precautions including but not limited to vaginal bleeding, contractions, leaking of fluid and fetal movement were reviewed in detail with the patient. Please refer to After Visit Summary for other counseling recommendations.    Return in about 4 weeks (around 05/21/2021) for 2 hr gtt, see MW wants water birth.  Future Appointments  Date Time Provider Department Center  05/25/2021  8:00 AM [facility] None  05/25/2021  8:15 AM Leftwich-Kirby, Wilmer Floor, CNM [facility] None    Scheryl Darter, MD

## 2021-04-23 NOTE — Patient Instructions (Signed)

## 2021-04-23 NOTE — Progress Notes (Signed)
Pt reports fetal movement, denies pain.  

## 2021-05-25 ENCOUNTER — Other Ambulatory Visit: Payer: Medicaid Other

## 2021-05-25 ENCOUNTER — Ambulatory Visit (INDEPENDENT_AMBULATORY_CARE_PROVIDER_SITE_OTHER): Payer: Medicaid Other | Admitting: Advanced Practice Midwife

## 2021-05-25 ENCOUNTER — Other Ambulatory Visit: Payer: Self-pay

## 2021-05-25 VITALS — BP 111/74 | HR 78 | Wt 209.0 lb

## 2021-05-25 DIAGNOSIS — Z23 Encounter for immunization: Secondary | ICD-10-CM

## 2021-05-25 DIAGNOSIS — Z3483 Encounter for supervision of other normal pregnancy, third trimester: Secondary | ICD-10-CM | POA: Diagnosis not present

## 2021-05-25 DIAGNOSIS — Z2913 Encounter for prophylactic Rho(D) immune globulin: Secondary | ICD-10-CM

## 2021-05-25 DIAGNOSIS — N949 Unspecified condition associated with female genital organs and menstrual cycle: Secondary | ICD-10-CM

## 2021-05-25 DIAGNOSIS — Z3490 Encounter for supervision of normal pregnancy, unspecified, unspecified trimester: Secondary | ICD-10-CM

## 2021-05-25 DIAGNOSIS — Z3A28 28 weeks gestation of pregnancy: Secondary | ICD-10-CM

## 2021-05-25 DIAGNOSIS — O26899 Other specified pregnancy related conditions, unspecified trimester: Secondary | ICD-10-CM

## 2021-05-25 MED ORDER — RHO D IMMUNE GLOBULIN 1500 UNIT/2ML IJ SOSY
300.0000 ug | PREFILLED_SYRINGE | Freq: Once | INTRAMUSCULAR | Status: AC
  Administered 2021-05-25: 300 ug via INTRAMUSCULAR

## 2021-05-25 NOTE — Patient Instructions (Signed)
Considering Waterbirth? Guide for patients at Center for [facility]) Why consider waterbirth? . Gentle birth for babies  . Less pain medicine used in labor  . May allow for passive descent/less pushing  . May reduce perineal tears  . More mobility and instinctive maternal position changes  . Increased maternal relaxation   Is waterbirth safe? What are the risks of infection, drowning or other complications? . Infection:  Marland Kitchen Very low risk (3.7 % for tub vs 4.8% for bed)  . 7 in 10 waterbirths with documented infection  . Poorly cleaned equipment most common cause  . Slightly lower group B strep transmission rate  . Drowning  . Maternal:  . Very low risk  . Related to seizures or fainting  . Newborn:  Marland Kitchen Very low risk. No evidence of increased risk of respiratory problems in multiple large studies  . Physiological protection from breathing under water  . Avoid underwater birth if there are any fetal complications  . Once baby's head is out of the water, keep it out.  . Birth complication  . Some reports of cord trauma, but risk decreased by bringing baby to surface gradually  . No evidence of increased risk of shoulder dystocia. Mothers can usually change positions faster in water than in a bed, possibly aiding the maneuvers to free the shoulder.   There are 2 things you MUST do to have a waterbirth with [facility]: 1. Attend a waterbirth class at [place] at [facility]   a. 3rd Wednesday of every month from 7-9 pm (virtual during [place]) b. Free c. Register online at [email or web address] or [email or web address] or by calling [phone] d. Bring [place] the certificate from the class to your prenatal appointment or send via MyChart 2. Meet with a midwife at 36 weeks* to see if you can still plan a waterbirth and to sign the consent.   *We also recommend that you schedule as many of your prenatal visits with a midwife as possible.    Helpful  information: . You may want to bring a bathing suit top to the hospital to wear during labor but this is optional.  All other supplies are provided by the hospital. . Please arrive at the hospital with signs of active labor, and do not wait at home until late in labor. It takes 45 min- 2 hours for COVID testing, fetal monitoring, and check in to your room to take place, plus transport and filling of the waterbirth tub.    Things that would prevent you from having a waterbirth: . Unknown or Positive COVID-19 diagnosis upon admission to hospital* . Premature, <37wks  . Previous cesarean birth  . Presence of thick meconium-stained fluid  . Multiple gestation (Twins, triplets, etc.)  . Uncontrolled diabetes or gestational diabetes requiring medication  . Hypertension diagnosed in pregnancy or preexisting hypertension (gestational hypertension, preeclampsia, or chronic hypertension) . Fetal growth restriction (your baby measures less than 10th percentile on ultrasound) . Heavy vaginal bleeding  . Non-reassuring fetal heart rate  . Active infection (MRSA, etc.). Group B Strep is NOT a contraindication for waterbirth.  . If your labor has to be induced and induction method requires continuous monitoring of the baby's heart rate  . Other risks/issues identified by your obstetrical provider   Please remember that birth is unpredictable. Under certain unforeseeable circumstances your provider may advise against giving birth in the tub. These decisions will be made on a case-by-case basis and with the safety of you  and your baby as our highest priority.   *Please remember that in order to have a waterbirth, you must test Negative to COVID-19 upon admission to the hospital.  Updated 03/30/21  

## 2021-05-25 NOTE — Progress Notes (Signed)
   PRENATAL VISIT NOTE  Subjective:  Rachel Heath is a 30 y.o. [phone] at [redacted]w[redacted]d being seen today for ongoing prenatal care.  She is currently monitored for the following issues for this low-risk pregnancy and has Sickle cell trait (HCC); Rh negative state in antepartum period; Heart murmur; Family history of sickle cell trait; Supervision of normal pregnancy, antepartum; Obesity in pregnancy; Urinary tract infection in pregnancy, antepartum, first trimester; and High serum vitamin D on their problem list.  Patient reports occasional sharp pain in lower abdomen/groin area.  Contractions: Not present. Vag. Bleeding: None.  Movement: Present. Denies leaking of fluid.   The following portions of the patient's history were reviewed and updated as appropriate: allergies, current medications, past family history, past medical history, past social history, past surgical history and problem list.   Objective:   Vitals:   05/25/21 0820  BP: 111/74  Pulse: 78  Weight: 209 lb (94.8 kg)    Fetal Status: Fetal Heart Rate (bpm): 138   Movement: Present     General:  Alert, oriented and cooperative. Patient is in no acute distress.  Skin: Skin is warm and dry. No rash noted.   Cardiovascular: Normal heart rate noted  Respiratory: Normal respiratory effort, no problems with respiration noted  Abdomen: Soft, gravid, appropriate for gestational age.  Pain/Pressure: Absent     Pelvic: Cervical exam deferred        Extremities: Normal range of motion.  Edema: None  Mental Status: Normal mood and affect. Normal behavior. Normal judgment and thought content.   Assessment and Plan:  Pregnancy: [id number] at [redacted]w[redacted]d 1. Encounter for supervision of other normal pregnancy in third trimester --Anticipatory guidance about next visits/weeks of pregnancy given. --Next visit in 2 weeks with midwife. Class completed, will MyChart the certificate.  --Interested in waterbirth, questions answered. Pt to enroll in  class, see midwives for most visits. Discussed waterbirth as option for low risk pregnancy. Pregnancy is hard to predict and things may arise that prevent immersion in water but labor can be supported with freedom of movement, birthing ball, shower and birth can be supported in position of pt choosing, etc, even if waterbirth becomes unavailable. Pt states understanding.  - Glucose Tolerance, 2 Hours w/1 Hour - CBC - HIV antibody (with reflex) - RPR  2. [redacted] weeks gestation of pregnancy   3. Encounter for prophylactic administration of RhoGAM  - rho (d) immune globulin (RHIG/RHOPHYLAC) injection 300 mcg  4. Need for Tdap vaccination  - Tdap vaccine greater than or equal to 7yo IM  5. Pain of round ligament Rest/ice/heat/warm bath/increase PO fluids/Tylenol/pregnancy support belt  Preterm labor symptoms and general obstetric precautions including but not limited to vaginal bleeding, contractions, leaking of fluid and fetal movement were reviewed in detail with the patient. Please refer to After Visit Summary for other counseling recommendations.   No follow-ups on file.  No future appointments.  Sharen Counter, CNM

## 2021-05-26 LAB — CBC
Hematocrit: 32.7 % — ABNORMAL LOW (ref 34.0–46.6)
Hemoglobin: 10.8 g/dL — ABNORMAL LOW (ref 11.1–15.9)
MCH: 28.3 pg (ref 26.6–33.0)
MCHC: 33 g/dL (ref 31.5–35.7)
MCV: 86 fL (ref 79–97)
Platelets: 298 10*3/uL (ref 150–450)
RBC: 3.82 x10E6/uL (ref 3.77–5.28)
RDW: 12 % (ref 11.7–15.4)
WBC: 7 10*3/uL (ref 3.4–10.8)

## 2021-05-26 LAB — RPR: RPR Ser Ql: NONREACTIVE

## 2021-05-26 LAB — GLUCOSE TOLERANCE, 2 HOURS W/ 1HR
Glucose, 1 hour: 108 mg/dL (ref 65–179)
Glucose, 2 hour: 90 mg/dL (ref 65–152)
Glucose, Fasting: 78 mg/dL (ref 65–91)

## 2021-05-26 LAB — HIV ANTIBODY (ROUTINE TESTING W REFLEX): HIV Screen 4th Generation wRfx: NONREACTIVE

## 2021-06-01 MED ORDER — BUTALBITAL-APAP-CAFFEINE 50-325-40 MG PO TABS
1.0000 | ORAL_TABLET | Freq: Four times a day (QID) | ORAL | 0 refills | Status: DC | PRN
Start: 2021-06-01 — End: 2021-06-08

## 2021-06-08 ENCOUNTER — Other Ambulatory Visit: Payer: Self-pay

## 2021-06-08 ENCOUNTER — Ambulatory Visit (INDEPENDENT_AMBULATORY_CARE_PROVIDER_SITE_OTHER): Payer: Medicaid Other | Admitting: Advanced Practice Midwife

## 2021-06-08 VITALS — BP 114/75 | HR 81 | Wt 209.0 lb

## 2021-06-08 DIAGNOSIS — Z3A30 30 weeks gestation of pregnancy: Secondary | ICD-10-CM

## 2021-06-08 DIAGNOSIS — R519 Headache, unspecified: Secondary | ICD-10-CM

## 2021-06-08 DIAGNOSIS — Z3483 Encounter for supervision of other normal pregnancy, third trimester: Secondary | ICD-10-CM

## 2021-06-08 DIAGNOSIS — O26899 Other specified pregnancy related conditions, unspecified trimester: Secondary | ICD-10-CM

## 2021-06-08 MED ORDER — BUTALBITAL-APAP-CAFFEINE 50-325-40 MG PO TABS: 1.0000 | ORAL_TABLET | Freq: Four times a day (QID) | ORAL | 0 refills | Status: DC | PRN

## 2021-06-08 NOTE — Progress Notes (Signed)
   PRENATAL VISIT NOTE  Subjective:  Rachel Heath is a 30 y.o. [phone] at [redacted]w[redacted]d being seen today for ongoing prenatal care.  She is currently monitored for the following issues for this low-risk pregnancy and has Sickle cell trait (HCC); Rh negative state in antepartum period; Heart murmur; Family history of sickle cell trait; Supervision of normal pregnancy, antepartum; Obesity in pregnancy; Urinary tract infection in pregnancy, antepartum, first trimester; and High serum vitamin D on their problem list.  Patient reports headache.  Contractions: Irritability. Vag. Bleeding: None.  Movement: Present. Denies leaking of fluid.   The following portions of the patient's history were reviewed and updated as appropriate: allergies, current medications, past family history, past medical history, past social history, past surgical history and problem list.   Objective:   Vitals:   06/08/21 0839  BP: 114/75  Pulse: 81  Weight: 209 lb (94.8 kg)    Fetal Status: Fetal Heart Rate (bpm): 130   Movement: Present     General:  Alert, oriented and cooperative. Patient is in no acute distress.  Skin: Skin is warm and dry. No rash noted.   Cardiovascular: Normal heart rate noted  Respiratory: Normal respiratory effort, no problems with respiration noted  Abdomen: Soft, gravid, appropriate for gestational age.  Pain/Pressure: Present     Pelvic: Cervical exam deferred        Extremities: Normal range of motion.     Mental Status: Normal mood and affect. Normal behavior. Normal judgment and thought content.   Assessment and Plan:  Pregnancy: [id number] at [redacted]w[redacted]d 1. [redacted] weeks gestation of pregnancy   2. Encounter for supervision of other normal pregnancy in third trimester --Anticipatory guidance about next visits/weeks of pregnancy given. --Next visit in 2 weeks with midwife, interest in waterbirth   3. Headache in pregnancy, antepartum --Occasional, several times per week. Rx last visit for  Fioricet, but not at pharmacy so she never picked up. --BP normal, no s/sx of PEC --Drink plenty of fluids, eat regularly, try Tylenol with small amount of caffeine with soda or tea.  Try warm bath, relaxation. --Rx for Fioricet to use sparingly. - butalbital-acetaminophen-caffeine (FIORICET) 50-325-40 MG tablet; Take 1-2 tablets by mouth every 6 (six) hours as needed for headache.  Dispense: 20 tablet; Refill: 0   Preterm labor symptoms and general obstetric precautions including but not limited to vaginal bleeding, contractions, leaking of fluid and fetal movement were reviewed in detail with the patient. Please refer to After Visit Summary for other counseling recommendations.   Return in about 2 weeks (around 06/22/2021).  No future appointments.   Sharen Counter, CNM

## 2021-06-08 NOTE — Patient Instructions (Signed)
General Headache Without Cause A headache is pain or discomfort felt around the head or neck area. The specific cause of a headache may not be found. There are many causes and types of headaches. A few common ones are: Tension headaches. Migraine headaches. Cluster headaches. Chronic daily headaches. Follow these instructions at home: Watch your condition for any changes. Let your health care provider know aboutthem. Take these steps to help with your condition: Managing pain     Take over-the-counter and prescription medicines only as told by your health care provider. Lie down in a dark, quiet room when you have a headache. If directed, put ice on your head and neck area: Put ice in a plastic bag. Place a towel between your skin and the bag. Leave the ice on for 20 minutes, 2-3 times per day. If directed, apply heat to the affected area. Use the heat source that your health care provider recommends, such as a moist heat pack or a heating pad. Place a towel between your skin and the heat source. Leave the heat on for 20-30 minutes. Remove the heat if your skin turns bright red. This is especially important if you are unable to feel pain, heat, or cold. You may have a greater risk of getting burned. Keep lights dim if bright lights bother you or make your headaches worse. Eating and drinking Eat meals on a regular schedule. If you drink alcohol: Limit how much you use to: 0-1 drink a day for women. 0-2 drinks a day for men. Be aware of how much alcohol is in your drink. In the [place] one drink equals one 12 oz bottle of beer (355 mL), one 5 oz glass of wine (148 mL), or one 1 oz glass of hard liquor (44 mL). Stop drinking caffeine, or decrease the amount of caffeine you drink. General instructions  Keep a headache journal to help find out what may trigger your headaches. For example, write down: What you eat and drink. How much sleep you get. Any change to your diet or  medicines. Try massage or other relaxation techniques. Limit stress. Sit up straight, and do not tense your muscles. Do not use any products that contain nicotine or tobacco, such as cigarettes, e-cigarettes, and chewing tobacco. If you need help quitting, ask your health care provider. Exercise regularly as told by your health care provider. Sleep on a regular schedule. Get 7-9 hours of sleep each night, or the amount recommended by your health care provider. Keep all follow-up visits as told by your health care provider. This is important.  Contact a health care provider if: Your symptoms are not helped by medicine. You have a headache that is different from the usual headache. You have nausea or you vomit. You have a fever. Get help right away if: Your headache becomes severe quickly. Your headache gets worse after moderate to intense physical activity. You have repeated vomiting. You have a stiff neck. You have a loss of vision. You have problems with speech. You have pain in the eye or ear. You have muscular weakness or loss of muscle control. You lose your balance or have trouble walking. You feel faint or pass out. You have confusion. You have a seizure. Summary A headache is pain or discomfort felt around the head or neck area. There are many causes and types of headaches. In some cases, the cause may not be found. Keep a headache journal to help find out what may trigger your headaches. Watch   your condition for any changes. Let your health care provider know about them. Contact a health care provider if you have a headache that is different from the usual headache, or if your symptoms are not helped by medicine. Get help right away if your headache becomes severe, you vomit, you have a loss of vision, you lose your balance, or you have a seizure. This information is not intended to replace advice given to you by your health care provider. Make sure you discuss any questions  you have with your healthcare provider. Document Revised: 06/26/2018 Document Reviewed: 06/26/2018 Elsevier Patient Education  2022 Elsevier Inc.  

## 2021-06-23 ENCOUNTER — Ambulatory Visit (INDEPENDENT_AMBULATORY_CARE_PROVIDER_SITE_OTHER): Payer: Medicaid Other | Admitting: Advanced Practice Midwife

## 2021-06-23 ENCOUNTER — Encounter: Payer: Self-pay | Admitting: Advanced Practice Midwife

## 2021-06-23 ENCOUNTER — Other Ambulatory Visit: Payer: Self-pay

## 2021-06-23 VITALS — BP 118/74 | HR 92 | Wt 210.2 lb

## 2021-06-23 DIAGNOSIS — O2341 Unspecified infection of urinary tract in pregnancy, first trimester: Secondary | ICD-10-CM

## 2021-06-23 DIAGNOSIS — Z3A32 32 weeks gestation of pregnancy: Secondary | ICD-10-CM

## 2021-06-23 DIAGNOSIS — R011 Cardiac murmur, unspecified: Secondary | ICD-10-CM

## 2021-06-23 DIAGNOSIS — Z3483 Encounter for supervision of other normal pregnancy, third trimester: Secondary | ICD-10-CM

## 2021-06-23 NOTE — Progress Notes (Signed)
   PRENATAL VISIT NOTE  Subjective:  Rachel Heath is a 30 y.o. [phone] at [redacted]w[redacted]d being seen today for ongoing prenatal care.  She is currently monitored for the following issues for this low-risk pregnancy and has Sickle cell trait (HCC); Rh negative state in antepartum period; Heart murmur; Family history of sickle cell trait; Supervision of normal pregnancy, antepartum; Obesity in pregnancy; Urinary tract infection in pregnancy, antepartum, first trimester; and High serum vitamin D on their problem list.  Patient reports occasional contractions.  Contractions: Irritability. Vag. Bleeding: None.  Movement: Present. Denies leaking of fluid.   The following portions of the patient's history were reviewed and updated as appropriate: allergies, current medications, past family history, past medical history, past social history, past surgical history and problem list.   Objective:   Vitals:   06/23/21 1355  BP: 118/74  Pulse: 92  Weight: 210 lb 3.2 oz (95.3 kg)    Fetal Status: Fetal Heart Rate (bpm): 133   Movement: Present     General:  Alert, oriented and cooperative. Patient is in no acute distress.  Skin: Skin is warm and dry. No rash noted.   Cardiovascular: Normal heart rate noted  Respiratory: Normal respiratory effort, no problems with respiration noted  Abdomen: Soft, gravid, appropriate for gestational age.  Pain/Pressure: Absent     Pelvic: Cervical exam deferred        Extremities: Normal range of motion.     Mental Status: Normal mood and affect. Normal behavior. Normal judgment and thought content.   Assessment and Plan:  Pregnancy: [id number] at [redacted]w[redacted]d 1. Encounter for supervision of other normal pregnancy in third trimester --Anticipatory guidance about next visits/weeks of pregnancy given. --Waterbirth class certificate scanned in chart and consent signed and witnessed today. Pt to see midwives for most visits when possible. --Next appt in 2 weeks  2. [redacted] weeks  gestation of pregnancy   3. Heart murmur --Pt saw cardiology on 03/03/21 and echocardiogram was recommended.  Discussed with pt who is to follow up. --No s/sx   Preterm labor symptoms and general obstetric precautions including but not limited to vaginal bleeding, contractions, leaking of fluid and fetal movement were reviewed in detail with the patient. Please refer to After Visit Summary for other counseling recommendations.   Return in about 2 weeks (around 07/07/2021).  Future Appointments  Date Time Provider Department Center  07/06/2021  9:55 AM Leftwich-Kirby, Wilmer Floor, CNM [facility] None     Sharen Counter, CNM

## 2021-07-06 ENCOUNTER — Other Ambulatory Visit: Payer: Self-pay

## 2021-07-06 ENCOUNTER — Ambulatory Visit (INDEPENDENT_AMBULATORY_CARE_PROVIDER_SITE_OTHER): Payer: Medicaid Other | Admitting: Advanced Practice Midwife

## 2021-07-06 VITALS — BP 112/77 | HR 81 | Wt 207.0 lb

## 2021-07-06 DIAGNOSIS — Z3483 Encounter for supervision of other normal pregnancy, third trimester: Secondary | ICD-10-CM

## 2021-07-06 DIAGNOSIS — Z3A34 34 weeks gestation of pregnancy: Secondary | ICD-10-CM

## 2021-07-06 DIAGNOSIS — O219 Vomiting of pregnancy, unspecified: Secondary | ICD-10-CM

## 2021-07-06 DIAGNOSIS — R197 Diarrhea, unspecified: Secondary | ICD-10-CM

## 2021-07-06 DIAGNOSIS — O99210 Obesity complicating pregnancy, unspecified trimester: Secondary | ICD-10-CM

## 2021-07-06 MED ORDER — METOCLOPRAMIDE HCL 10 MG PO TABS: 10.0000 mg | ORAL_TABLET | Freq: Three times a day (TID) | ORAL | 0 refills | Status: DC | PRN

## 2021-07-06 NOTE — Progress Notes (Signed)
   PRENATAL VISIT NOTE  Subjective:  Rachel Heath is a 30 y.o. [phone] at [redacted]w[redacted]d being seen today for ongoing prenatal care.  She is currently monitored for the following issues for this low-risk pregnancy and has Sickle cell trait (HCC); Rh negative state in antepartum period; Heart murmur; Family history of sickle cell trait; Supervision of normal pregnancy, antepartum; Obesity in pregnancy; Urinary tract infection in pregnancy, antepartum, first trimester; and High serum vitamin D on their problem list.  Patient reports  frequent nausea and loose stools 3-4 times daily x 2 weeks .  Contractions: Irritability. Vag. Bleeding: None.  Movement: Present. Denies leaking of fluid.   The following portions of the patient's history were reviewed and updated as appropriate: allergies, current medications, past family history, past medical history, past social history, past surgical history and problem list.   Objective:   Vitals:   07/06/21 0955  BP: 112/77  Pulse: 81  Weight: 207 lb (93.9 kg)    Fetal Status: Fetal Heart Rate (bpm): 126   Movement: Present     General:  Alert, oriented and cooperative. Patient is in no acute distress.  Skin: Skin is warm and dry. No rash noted.   Cardiovascular: Normal heart rate noted  Respiratory: Normal respiratory effort, no problems with respiration noted  Abdomen: Soft, gravid, appropriate for gestational age.  Pain/Pressure: Absent     Pelvic: Cervical exam deferred        Extremities: Normal range of motion.  Edema: None  Mental Status: Normal mood and affect. Normal behavior. Normal judgment and thought content.   Assessment and Plan:  Pregnancy: [id number] at [redacted]w[redacted]d 1. Encounter for supervision of other normal pregnancy in third trimester --Anticipatory guidance about next visits/weeks of pregnancy given. --Next visit in 2 weeks in office for GBS  2. [redacted] weeks gestation of pregnancy   3. Obesity in pregnancy   4. Nausea and vomiting during  pregnancy --Pt with increased nausea and decreased appetite. --Reviewed dietary changes including small frequent meals, increased protein. - metoCLOPramide (REGLAN) 10 MG tablet; Take 1 tablet (10 mg total) by mouth 3 (three) times daily with meals as needed for nausea.  Dispense: 30 tablet; Refill: 0  6. Diarrhea, unspecified type --Loose stool x 3-4 daily --x 2 weeks, no sick contacts, no associated symptoms, likely pregnancy related.  Reviewed dietary changes, add probiotic foods/supplements. --Safe OTC medication list given, including imodium to use PRN --F/U if persists or worsening   Preterm labor symptoms and general obstetric precautions including but not limited to vaginal bleeding, contractions, leaking of fluid and fetal movement were reviewed in detail with the patient. Please refer to After Visit Summary for other counseling recommendations.   Return in about 2 weeks (around 07/20/2021).  Future Appointments  Date Time Provider Department Center  07/27/2021  9:55 AM Leftwich-Kirby, Wilmer Floor, CNM [facility] None     Sharen Counter, CNM

## 2021-07-06 NOTE — Patient Instructions (Signed)

## 2021-07-27 ENCOUNTER — Ambulatory Visit (INDEPENDENT_AMBULATORY_CARE_PROVIDER_SITE_OTHER): Payer: Medicaid Other | Admitting: Advanced Practice Midwife

## 2021-07-27 ENCOUNTER — Other Ambulatory Visit: Payer: Self-pay

## 2021-07-27 ENCOUNTER — Other Ambulatory Visit (HOSPITAL_COMMUNITY)
Admission: RE | Admit: 2021-07-27 | Discharge: 2021-07-27 | Disposition: A | Discharge status: Home / Self Care | Payer: Medicaid Other | Source: Ambulatory Visit | Attending: Advanced Practice Midwife | Admitting: Advanced Practice Midwife

## 2021-07-27 VITALS — BP 117/76 | HR 79 | Wt 209.4 lb

## 2021-07-27 DIAGNOSIS — Z3483 Encounter for supervision of other normal pregnancy, third trimester: Secondary | ICD-10-CM | POA: Diagnosis not present

## 2021-07-27 DIAGNOSIS — O8620 Urinary tract infection following delivery, unspecified: Secondary | ICD-10-CM

## 2021-07-27 DIAGNOSIS — Z3A37 37 weeks gestation of pregnancy: Secondary | ICD-10-CM

## 2021-07-27 NOTE — Progress Notes (Signed)
ROB 37.1 wks No complaints. Requests SVE  Wants to discuss contraception method she is thinking about for PP.

## 2021-07-27 NOTE — Progress Notes (Signed)
   PRENATAL VISIT NOTE  Subjective:  Rachel Heath is a 30 y.o. [phone] at [redacted]w[redacted]d being seen today for ongoing prenatal care.  She is currently monitored for the following issues for this low-risk pregnancy and has Sickle cell trait (HCC); Rh negative state in antepartum period; Heart murmur; Family history of sickle cell trait; Supervision of normal pregnancy, antepartum; Obesity in pregnancy; Urinary tract infection in pregnancy, antepartum, first trimester; and High serum vitamin D on their problem list.  Patient reports occasional contractions.  Contractions: Irritability. Vag. Bleeding: None.  Movement: Present. Denies leaking of fluid.   The following portions of the patient's history were reviewed and updated as appropriate: allergies, current medications, past family history, past medical history, past social history, past surgical history and problem list.   Objective:   Vitals:   07/27/21 1003  BP: 117/76  Pulse: 79  Weight: 209 lb 6.4 oz (95 kg)    Fetal Status: Fetal Heart Rate (bpm): 135 Fundal Height: 37 cm Movement: Present  Presentation: Vertex  General:  Alert, oriented and cooperative. Patient is in no acute distress.  Skin: Skin is warm and dry. No rash noted.   Cardiovascular: Normal heart rate noted  Respiratory: Normal respiratory effort, no problems with respiration noted  Abdomen: Soft, gravid, appropriate for gestational age.  Pain/Pressure: Present     Pelvic: Cervical exam performed in the presence of a chaperone Dilation: Closed Effacement (%): 50 Station: -3  Extremities: Normal range of motion.  Edema: None  Mental Status: Normal mood and affect. Normal behavior. Normal judgment and thought content.   Assessment and Plan:  Pregnancy: [id number] at [redacted]w[redacted]d 1. Encounter for supervision of other normal pregnancy in third trimester --Anticipatory guidance about next visits/weeks of pregnancy given. --Reviewed labor readiness with patient including the [facility]  [employer]uit, evening primrose oil, and raspberry leaf tea.   --No restrictions to waterbirth at this time, class, consent, midwife visits completed --GBS and GCC done today --Next visit in 1 week with midwife  2. [redacted] weeks gestation of pregnancy  Term labor symptoms and general obstetric precautions including but not limited to vaginal bleeding, contractions, leaking of fluid and fetal movement were reviewed in detail with the patient. Please refer to After Visit Summary for other counseling recommendations.   Return in about 1 week (around 08/03/2021).  No future appointments.  Sharen Counter, CNM

## 2021-07-27 NOTE — Patient Instructions (Signed)
Things to Try After 37 weeks to Encourage Labor/Get Ready for Labor:    Try the [employer] at www.milescircuit.com daily to improve baby's position and encourage the onset of labor.  Walk a little and rest a little every day.  Change positions often.  Cervical Ripening: May try one or both Red Raspberry Leaf capsules or tea:  two 300mg or 400mg tablets with each meal, 2-3 times a day, or 1-3 cups of tea daily  Potential Side Effects Of Raspberry Leaf:  Most women do not experience any side effects from drinking raspberry leaf tea. However, nausea and loose stools are possible   Evening Primrose Oil capsules: take 1 capsule by mouth and place one capsule in the vagina every night.    Some of the potential side effects:  Upset stomach  Loose stools or diarrhea  Headaches  Nausea  Sex can also help the cervix ripen and encourage labor onset.    Labor Precautions Reasons to come to MAU at [facility]:  1.  Contractions are  5 minutes apart or less, each last 1 minute, these have been going on for 1-2 hours, and you cannot walk or talk during them 2.  You have a large gush of fluid, or a trickle of fluid that will not stop and you have to wear a pad 3.  You have bleeding that is bright red, heavier than spotting--like menstrual bleeding (spotting can be normal in early labor or after a check of your cervix) 4.  You do not feel the baby moving like he/she normally does  

## 2021-07-28 LAB — CERVICOVAGINAL ANCILLARY ONLY
Chlamydia: NEGATIVE
Comment: NEGATIVE
Comment: NORMAL
Neisseria Gonorrhea: NEGATIVE

## 2021-07-31 LAB — CULTURE, BETA STREP (GROUP B ONLY): Strep Gp B Culture: NEGATIVE

## 2021-08-05 ENCOUNTER — Other Ambulatory Visit: Payer: Self-pay

## 2021-08-05 ENCOUNTER — Inpatient Hospital Stay (EMERGENCY_DEPARTMENT_HOSPITAL)
Admission: AD | Admit: 2021-08-05 | Discharge: 2021-08-06 | Disposition: A | Discharge status: Home / Self Care | Payer: Medicaid Other | Source: Home / Self Care | Attending: Obstetrics and Gynecology | Admitting: Obstetrics and Gynecology

## 2021-08-05 ENCOUNTER — Encounter (HOSPITAL_COMMUNITY): Payer: Self-pay | Admitting: Obstetrics and Gynecology

## 2021-08-05 ENCOUNTER — Ambulatory Visit (INDEPENDENT_AMBULATORY_CARE_PROVIDER_SITE_OTHER): Payer: Medicaid Other | Admitting: Women's Health

## 2021-08-05 VITALS — BP 126/78 | HR 90 | Wt 208.0 lb

## 2021-08-05 DIAGNOSIS — O133 Gestational [pregnancy-induced] hypertension without significant proteinuria, third trimester: Secondary | ICD-10-CM | POA: Insufficient documentation

## 2021-08-05 DIAGNOSIS — Z3A38 38 weeks gestation of pregnancy: Secondary | ICD-10-CM

## 2021-08-05 DIAGNOSIS — O368130 Decreased fetal movements, third trimester, not applicable or unspecified: Secondary | ICD-10-CM | POA: Insufficient documentation

## 2021-08-05 DIAGNOSIS — O479 False labor, unspecified: Secondary | ICD-10-CM

## 2021-08-05 DIAGNOSIS — D573 Sickle-cell trait: Secondary | ICD-10-CM | POA: Insufficient documentation

## 2021-08-05 DIAGNOSIS — R7989 Other specified abnormal findings of blood chemistry: Secondary | ICD-10-CM

## 2021-08-05 DIAGNOSIS — I10 Essential (primary) hypertension: Secondary | ICD-10-CM

## 2021-08-05 DIAGNOSIS — O163 Unspecified maternal hypertension, third trimester: Secondary | ICD-10-CM | POA: Diagnosis not present

## 2021-08-05 DIAGNOSIS — O471 False labor at or after 37 completed weeks of gestation: Secondary | ICD-10-CM

## 2021-08-05 DIAGNOSIS — Z3480 Encounter for supervision of other normal pregnancy, unspecified trimester: Secondary | ICD-10-CM

## 2021-08-05 DIAGNOSIS — O99210 Obesity complicating pregnancy, unspecified trimester: Secondary | ICD-10-CM

## 2021-08-05 DIAGNOSIS — Z6791 Unspecified blood type, Rh negative: Secondary | ICD-10-CM

## 2021-08-05 DIAGNOSIS — O26899 Other specified pregnancy related conditions, unspecified trimester: Secondary | ICD-10-CM

## 2021-08-05 NOTE — MAU Provider Note (Signed)
Chief Complaint:  Contractions and Decreased Fetal Movement   Event Date/Time   First Provider Initiated Contact with Patient 08/05/21 2304     HPI: Rachel Heath is a 30 y.o. [id number] at 36w3dwho presents to maternity admissions reporting contractions.  While being evaluated for labor by RN , noted to have increased blood pressure. . She reports good fetal movement, denies LOF, vaginal bleeding, vaginal itching/burning, urinary symptoms, h/a, dizziness, n/v, diarrhea, constipation or fever/chills.  She denies headache, visual changes or RUQ abdominal pain.  Hypertension This is a new problem. The current episode started today. The problem has been waxing and waning since onset. Pertinent negatives include no anxiety, blurred vision, chest pain or headaches. There are no associated agents to hypertension. There are no known risk factors for coronary artery disease. Past treatments include nothing. There are no compliance problems.    RN Note: Pt reports contractions that are 4-5 mins apart, states they've been going on for 3 days, but have gotten worse today. Denies VB, LOF. Also reports decreased decreased fetal movement  Past Medical History: Past Medical History:  Diagnosis Date   Ganglion cyst    right hand   Heart murmur    per parent   Sickle cell trait (HCC)     Past obstetric history: OB History  Gravida Para Term Preterm AB Living  6 3 3   2 3   SAB IAB Ectopic Multiple Live Births  1 1   0 3    # Outcome Date GA Lbr Len/2nd Weight Sex Delivery Anes PTL Lv  6 Current           5 Term 11/04/18 [redacted]w[redacted]d 04:02 / 00:02 2835 g F Vag-Spont None  LIV  4 Term 03/18/17 [redacted]w[redacted]d / 00:57 3430 g M Vag-Spont EPI  LIV     Birth Comments: WNL   3 Term 04/19/14 [redacted]w[redacted]d 09:59 / 02:18 2948 g M Vag-Spont EPI  LIV  2 IAB           1 SAB             Past Surgical History: Past Surgical History:  Procedure Laterality Date   HAND SURGERY      Family History: Family History  Problem  Relation Age of Onset   Hypertension Mother    Heart disease Maternal Grandfather     Social History: Social History   Tobacco Use   Smoking status: Former    Types: Cigars    Quit date: 05/19/2016    Years since quitting: 5.2   Smokeless tobacco: Never  Vaping Use   Vaping Use: Never used  Substance Use Topics   Alcohol use: No   Drug use: No    Frequency: 3.0 times per week    Allergies: No Known Allergies  Meds:  Medications Prior to Admission  Medication Sig Dispense Refill Last Dose   Prenatal Vit w/Fe-Methylfol-FA (PNV PO) Take by mouth.   08/05/2021   butalbital-acetaminophen-caffeine (FIORICET) 50-325-40 MG tablet Take 1-2 tablets by mouth every 6 (six) hours as needed for headache. (Patient not taking: Reported on 07/06/2021) 20 tablet 0    metoCLOPramide (REGLAN) 10 MG tablet Take 1 tablet (10 mg total) by mouth 3 (three) times daily with meals as needed for nausea. 30 tablet 0 Unknown    I have reviewed patient's Past Medical Hx, Surgical Hx, Family Hx, Social Hx, medications and allergies.   ROS:  Review of Systems  Eyes:  Negative for blurred vision.  Cardiovascular:  Negative for chest pain.  Neurological:  Negative for headaches.  Other systems negative  Physical Exam  Patient Vitals for the past 24 hrs:  BP Temp Temp src Pulse Resp SpO2  08/05/21 2301 131/74 -- -- 81 -- --  08/05/21 2300 -- -- -- -- -- 100 %  08/05/21 2255 -- -- -- -- -- 99 %  08/05/21 2250 -- -- -- -- -- 99 %  08/05/21 2246 138/65 -- -- 89 -- --  08/05/21 2245 -- -- -- -- -- 99 %  08/05/21 2234 137/76 -- -- 88 -- --  08/05/21 2230 -- -- -- -- -- 100 %  08/05/21 2225 -- -- -- -- -- 100 %  08/05/21 2220 -- -- -- -- -- 100 %  08/05/21 2218 140/76 99.1 F (37.3 C) Oral 91 17 100 %   Constitutional: Well-developed, well-nourished female in no acute distress.  Cardiovascular: normal rate and rhythm Respiratory: normal effort, clear to auscultation bilaterally GI: Abd soft,  non-tender, gravid appropriate for gestational age.   No rebound or guarding. MS: Extremities nontender, no edema, normal ROM Neurologic: Alert and oriented x 4.  GU: Neg CVAT.  PELVIC EXAM:  Dilation: 1.5 Effacement (%): 50 Cervical Position: Posterior Station: -3 Presentation: Vertex Exam by:: Bari Mantis RN  Dilation: 2.5 Effacement (%): 50 Cervical Position: Posterior Station: -3 Presentation: Vertex Exam by:: Kirtland Bouchard Torphy, RN  FHT:  Baseline 140 , moderate variability, accelerations present, no decelerations Contractions: Irregular     Labs: Results for orders placed or performed during the hospital encounter of 08/05/21 (from the past 24 hour(s))  Protein / creatinine ratio, urine     Status: Abnormal   Collection Time: 08/05/21 10:56 PM  Result Value Ref Range   Creatinine, Urine 45.87 mg/dL   Total Protein, Urine 11 mg/dL   Protein Creatinine Ratio 0.24 (H) 0.00 - 0.15 mg/mg[Cre]  CBC     Status: Abnormal   Collection Time: 08/05/21 11:32 PM  Result Value Ref Range   WBC 12.1 (H) 4.0 - 10.5 K/uL   RBC 3.75 (L) 3.87 - 5.11 MIL/uL   Hemoglobin 10.4 (L) 12.0 - 15.0 g/dL   HCT 62.7 (L) 03.5 - 00.9 %   MCV 80.3 80.0 - 100.0 fL   MCH 27.7 26.0 - 34.0 pg   MCHC 34.6 30.0 - 36.0 g/dL   RDW 38.1 82.9 - 93.7 %   Platelets 330 150 - 400 K/uL   nRBC 0.0 0.0 - 0.2 %  Comprehensive metabolic panel     Status: Abnormal   Collection Time: 08/05/21 11:32 PM  Result Value Ref Range   Sodium 134 (L) 135 - 145 mmol/L   Potassium 3.1 (L) 3.5 - 5.1 mmol/L   Chloride 103 98 - 111 mmol/L   CO2 22 22 - 32 mmol/L   Glucose, Bld 85 70 - 99 mg/dL   BUN <5 (L) 6 - 20 mg/dL   Creatinine, Ser 1.69 0.44 - 1.00 mg/dL   Calcium 8.7 (L) 8.9 - 10.3 mg/dL   Total Protein 6.6 6.5 - 8.1 g/dL   Albumin 2.9 (L) 3.5 - 5.0 g/dL   AST 16 15 - 41 U/L   ALT 13 0 - 44 U/L   Alkaline Phosphatase 136 (H) 38 - 126 U/L   Total Bilirubin 1.1 0.3 - 1.2 mg/dL   GFR, Estimated >67 >89 mL/min   Anion gap 9 5  - 15    O/Negative/-- (02/09 1127)  Imaging:  No results found.  MAU Course/MDM: I have ordered labs to evaluate new hypertension and reviewed results. These were normal   BPs improved over time NST reviewed, reactive. She did make some change in cervix, but UC less painful later Consult Dr Vergie Living with presentation, exam findings and test results. Would recommend recheck BP this week and if remains elevated, schedule IOL Treatments in MAU included EFM.    Assessment: Single IUP at [redacted]w[redacted]d Prodromal contractions vs latent labor New hypertension, normal labs   Plan: Discharge home Recheck BP this week (message sent) Preeclampsia precautions Labor precautions and fetal kick counts Follow up in Office for prenatal visits and recheck BP Encouraged to return if she develops worsening of symptoms, increase in pain, fever, or other concerning symptoms.  Pt stable at time of discharge.  Wynelle Bourgeois CNM, MSN Certified Nurse-Midwife 08/05/2021 11:04 PM

## 2021-08-05 NOTE — MAU Note (Addendum)
Pt reports contractions that are 4-5 mins apart, states they've been going on for 3 days, but have gotten worse today. Denies VB, LOF. Also reports decreased decreased fetal movement.

## 2021-08-05 NOTE — Progress Notes (Signed)
Subjective:  Rachel Heath is a 30 y.o. [phone] at [redacted]w[redacted]d being seen today for ongoing prenatal care.  She is currently monitored for the following issues for this low-risk pregnancy and has Sickle cell trait (HCC); Rh negative state in antepartum period; Heart murmur; Family history of sickle cell trait; Supervision of normal pregnancy, antepartum; Obesity in pregnancy; Urinary tract infection in pregnancy, antepartum, first trimester; and High serum vitamin D on their problem list.  Patient reports no complaints.  Contractions: Irregular. Vag. Bleeding: None.  Movement: Present. Denies leaking of fluid.   The following portions of the patient's history were reviewed and updated as appropriate: allergies, current medications, past family history, past medical history, past social history, past surgical history and problem list. Problem list updated.  Objective:   Vitals:   08/05/21 0918  BP: 126/78  Pulse: 90  Weight: 208 lb (94.3 kg)    Fetal Status: Fetal Heart Rate (bpm): 135   Movement: Present     General:  Alert, oriented and cooperative. Patient is in no acute distress.  Skin: Skin is warm and dry. No rash noted.   Cardiovascular: Normal heart rate noted  Respiratory: Normal respiratory effort, no problems with respiration noted  Abdomen: Soft, gravid, appropriate for gestational age. Pain/Pressure: Present     Pelvic: Vag. Bleeding: None     Cervical exam performed Dilation: 1 Effacement (%): 50 Station: -3  Extremities: Normal range of motion.     Mental Status: Normal mood and affect. Normal behavior. Normal judgment and thought content.   Urinalysis:      Assessment and Plan:  Pregnancy: [id number] at [redacted]w[redacted]d  1. Supervision of other normal pregnancy, antepartum --Reviewed labor readiness with patient including the [employer], evening primrose oil, and raspberry leaf tea.  2. Rh negative state in antepartum period -RhoGAM 05/25/2021  3. Sickle cell trait (HCC) -  Urine Culture  4. High serum vitamin D - Vitamin D 1,25 dihydroxy - PTH, Intact and Calcium  Term labor symptoms and general obstetric precautions including but not limited to vaginal bleeding, contractions, leaking of fluid and fetal movement were reviewed in detail with the patient. I discussed the assessment and treatment plan with the patient. The patient was provided an opportunity to ask questions and all were answered. The patient agreed with the plan and demonstrated an understanding of the instructions. The patient was advised to call back or seek an in-person office evaluation/go to MAU at [facility] for any urgent or concerning symptoms. Please refer to After Visit Summary for other counseling recommendations.  Return in about 1 week (around 08/12/2021) for in-person LOB/APP OK.   Matther Labell, Odie Sera, NP'

## 2021-08-05 NOTE — Patient Instructions (Addendum)
Maternity Assessment Unit (MAU)  The Maternity Assessment Unit (MAU) is located at the [facility] at [facility]. The address is: [address] [place], [place], [place] [address]. Please see map below for additional directions.    The Maternity Assessment Unit is designed to help you during your pregnancy, and for up to 6 weeks after delivery, with any pregnancy- or postpartum-related emergencies, if you think you are in labor, or if your water has broken. For example, if you experience nausea and vomiting, vaginal bleeding, severe abdominal or pelvic pain, elevated blood pressure or other problems related to your pregnancy or postpartum time, please come to the Maternity Assessment Unit for assistance.       Signs and Symptoms of Labor Labor is the body's natural process of moving the baby and the placenta out of the uterus. The process of labor usually starts when the baby is full-term,between 37 and 40 weeks of pregnancy. Signs and symptoms that you are close to going into labor As your body prepares for labor and the birth of your baby, you may notice the following symptoms in the weeks and days before true labor starts: Passing a small amount of thick, bloody mucus from your vagina. This is called normal bloody show or losing your mucus plug. This may happen more than a week before labor begins, or right before labor begins, as the opening of the cervix starts to widen (dilate). For some women, the entire mucus plug passes at once. For others, pieces of the mucus plug may gradually pass over several days. Your baby moving (dropping) lower in your pelvis to get into position for birth (lightening). When this happens, you may feel more pressure on your bladder and pelvic bone and less pressure on your ribs. This may make it easier to breathe. It may also cause you to need to urinate more often and have problems with bowel movements. Having "practice  contractions," also called Braxton Hicks contractions or false labor. These occur at irregular (unevenly spaced) intervals that are more than 10 minutes apart. False labor contractions are common after exercise or sexual activity. They will stop if you change position, rest, or drink fluids. These contractions are usually mild and do not get stronger over time. They may feel like: A backache or back pain. Mild cramps, similar to menstrual cramps. Tightening or pressure in your abdomen. Other early symptoms include: Nausea or loss of appetite. Diarrhea. Having a sudden burst of energy, or feeling very tired. Mood changes. Having trouble sleeping. Signs and symptoms that labor has begun Signs that you are in labor may include: Having contractions that come at regular (evenly spaced) intervals and increase in intensity. This may feel like more intense tightening or pressure in your abdomen that moves to your back. Contractions may also feel like rhythmic pain in your upper thighs or back that comes and goes at regular intervals. For first-time mothers, this change in intensity of contractions often occurs at a more gradual pace. Women who have given birth before may notice a more rapid progression of contraction changes. Feeling pressure in the vaginal area. Your water breaking (rupture of membranes). This is when the sac of fluid that surrounds your baby breaks. Fluid leaking from your vagina may be clear or blood-tinged. Labor usually starts within 24 hours of your water breaking, but it may take longer to begin. Some women may feel a sudden gush of fluid. Others notice that their underwear repeatedly becomes  damp. Follow these instructions at home:  When labor starts, or if your water breaks, call your health care provider or nurse care line. Based on your situation, they will determine when you should go in for an exam. During early labor, you may be able to rest and manage symptoms at home.  Some strategies to try at home include: Breathing and relaxation techniques. Taking a warm bath or shower. Listening to music. Using a heating pad on the lower back for pain. If you are directed to use heat: Place a towel between your skin and the heat source. Leave the heat on for 20-30 minutes. Remove the heat if your skin turns bright red. This is especially important if you are unable to feel pain, heat, or cold. You may have a greater risk of getting burned. Contact a health care provider if: Your labor has started. Your water breaks. Get help right away if: You have painful, regular contractions that are 5 minutes apart or less. Labor starts before you are [redacted] weeks along in your pregnancy. You have a fever. You have bright red blood coming from your vagina. You do not feel your baby moving. You have a severe headache with or without vision problems. You have severe nausea, vomiting, or diarrhea. You have chest pain or shortness of breath. These symptoms may represent a serious problem that is an emergency. Do not wait to see if the symptoms will go away. Get medical help right away. Call your local emergency services (911 in the [place] Do not drive yourself to the hospital. Summary Labor is your body's natural process of moving your baby and the placenta out of your uterus. The process of labor usually starts when your baby is full-term, between 35 and 40 weeks of pregnancy. When labor starts, or if your water breaks, call your health care provider or nurse care line. Based on your situation, they will determine when you should go in for an exam. This information is not intended to replace advice given to you by your health care provider. Make sure you discuss any questions you have with your healthcare provider. Document Revised: 09/27/2020 Document Reviewed: 09/27/2020 Elsevier Patient Education  2022 Elsevier Inc.       Things to Try After 37 weeks to Encourage Labor/Get  Ready for Labor:    Try the [employer] at [email or web address]://glass.com/.com daily to improve baby's position and encourage the onset of labor.  Walk a little and rest a little every day.  Change positions often.  Cervical Ripening: May try one or both Red Raspberry Leaf capsules or tea:  two 300mg  or 400mg  tablets with each meal, 2-3 times a day, or 1-3 cups of tea daily  Potential Side Effects Of Raspberry Leaf:  Most women do not experience any side effects from drinking raspberry leaf tea. However, nausea and loose stools are possible   Evening Primrose Oil capsules: take 1 capsule by mouth and place one capsule in the vagina every night.    Some of the potential side effects:  Upset stomach  Loose stools or diarrhea  Headaches  Nausea  Sex can also help the cervix ripen and encourage labor onset.

## 2021-08-05 NOTE — Progress Notes (Signed)
Pt would like cervix check today, having increase in ctx.

## 2021-08-06 ENCOUNTER — Other Ambulatory Visit: Payer: Self-pay

## 2021-08-06 ENCOUNTER — Encounter (HOSPITAL_COMMUNITY): Payer: Self-pay | Admitting: Obstetrics and Gynecology

## 2021-08-06 ENCOUNTER — Inpatient Hospital Stay (HOSPITAL_COMMUNITY)
Admission: AD | Admit: 2021-08-06 | Discharge: 2021-08-08 | DRG: 807 | Disposition: A | Discharge status: Home / Self Care | Payer: Medicaid Other | Attending: Family Medicine | Admitting: Family Medicine

## 2021-08-06 ENCOUNTER — Inpatient Hospital Stay (HOSPITAL_COMMUNITY): Payer: Medicaid Other | Admitting: Anesthesiology

## 2021-08-06 DIAGNOSIS — Z20822 Contact with and (suspected) exposure to covid-19: Secondary | ICD-10-CM | POA: Diagnosis present

## 2021-08-06 DIAGNOSIS — O368130 Decreased fetal movements, third trimester, not applicable or unspecified: Secondary | ICD-10-CM | POA: Diagnosis present

## 2021-08-06 DIAGNOSIS — Z3490 Encounter for supervision of normal pregnancy, unspecified, unspecified trimester: Secondary | ICD-10-CM

## 2021-08-06 DIAGNOSIS — O134 Gestational [pregnancy-induced] hypertension without significant proteinuria, complicating childbirth: Secondary | ICD-10-CM | POA: Diagnosis present

## 2021-08-06 DIAGNOSIS — O26893 Other specified pregnancy related conditions, third trimester: Secondary | ICD-10-CM | POA: Diagnosis present

## 2021-08-06 DIAGNOSIS — Z87891 Personal history of nicotine dependence: Secondary | ICD-10-CM

## 2021-08-06 DIAGNOSIS — Z3A38 38 weeks gestation of pregnancy: Secondary | ICD-10-CM

## 2021-08-06 DIAGNOSIS — O471 False labor at or after 37 completed weeks of gestation: Secondary | ICD-10-CM | POA: Diagnosis not present

## 2021-08-06 DIAGNOSIS — O99210 Obesity complicating pregnancy, unspecified trimester: Secondary | ICD-10-CM

## 2021-08-06 DIAGNOSIS — Z6791 Unspecified blood type, Rh negative: Secondary | ICD-10-CM

## 2021-08-06 DIAGNOSIS — D573 Sickle-cell trait: Secondary | ICD-10-CM | POA: Diagnosis present

## 2021-08-06 DIAGNOSIS — O133 Gestational [pregnancy-induced] hypertension without significant proteinuria, third trimester: Secondary | ICD-10-CM

## 2021-08-06 DIAGNOSIS — O9902 Anemia complicating childbirth: Secondary | ICD-10-CM | POA: Diagnosis present

## 2021-08-06 DIAGNOSIS — O99214 Obesity complicating childbirth: Secondary | ICD-10-CM | POA: Diagnosis present

## 2021-08-06 DIAGNOSIS — O163 Unspecified maternal hypertension, third trimester: Secondary | ICD-10-CM | POA: Diagnosis not present

## 2021-08-06 DIAGNOSIS — R7989 Other specified abnormal findings of blood chemistry: Secondary | ICD-10-CM

## 2021-08-06 DIAGNOSIS — Z832 Family history of diseases of the blood and blood-forming organs and certain disorders involving the immune mechanism: Secondary | ICD-10-CM

## 2021-08-06 LAB — PROTEIN / CREATININE RATIO, URINE
Creatinine, Urine: 45.87 mg/dL
Protein Creatinine Ratio: 0.24 mg/mg{Cre} — ABNORMAL HIGH (ref 0.00–0.15)
Total Protein, Urine: 11 mg/dL

## 2021-08-06 LAB — CBC
HCT: 30.1 % — ABNORMAL LOW (ref 36.0–46.0)
HCT: 31.1 % — ABNORMAL LOW (ref 36.0–46.0)
Hemoglobin: 10.4 g/dL — ABNORMAL LOW (ref 12.0–15.0)
Hemoglobin: 10.6 g/dL — ABNORMAL LOW (ref 12.0–15.0)
MCH: 27.5 pg (ref 26.0–34.0)
MCH: 27.7 pg (ref 26.0–34.0)
MCHC: 34.1 g/dL (ref 30.0–36.0)
MCHC: 34.6 g/dL (ref 30.0–36.0)
MCV: 80.3 fL (ref 80.0–100.0)
MCV: 80.8 fL (ref 80.0–100.0)
Platelets: 330 10*3/uL (ref 150–400)
Platelets: 355 10*3/uL (ref 150–400)
RBC: 3.75 MIL/uL — ABNORMAL LOW (ref 3.87–5.11)
RBC: 3.85 MIL/uL — ABNORMAL LOW (ref 3.87–5.11)
RDW: 13.4 % (ref 11.5–15.5)
RDW: 13.6 % (ref 11.5–15.5)
WBC: 11.2 10*3/uL — ABNORMAL HIGH (ref 4.0–10.5)
WBC: 12.1 10*3/uL — ABNORMAL HIGH (ref 4.0–10.5)
nRBC: 0 % (ref 0.0–0.2)
nRBC: 0 % (ref 0.0–0.2)

## 2021-08-06 LAB — COMPREHENSIVE METABOLIC PANEL
ALT: 13 U/L (ref 0–44)
AST: 16 U/L (ref 15–41)
Albumin: 2.9 g/dL — ABNORMAL LOW (ref 3.5–5.0)
Alkaline Phosphatase: 136 U/L — ABNORMAL HIGH (ref 38–126)
Anion gap: 9 (ref 5–15)
BUN: <5 mg/dL — ABNORMAL LOW (ref 6–20)
CO2: 22 mmol/L (ref 22–32)
Calcium: 8.7 mg/dL — ABNORMAL LOW (ref 8.9–10.3)
Chloride: 103 mmol/L (ref 98–111)
Creatinine, Ser: 0.55 mg/dL (ref 0.44–1.00)
GFR, Estimated: >60 mL/min (ref >60)
Glucose, Bld: 85 mg/dL (ref 70–99)
Potassium: 3.1 mmol/L — ABNORMAL LOW (ref 3.5–5.1)
Sodium: 134 mmol/L — ABNORMAL LOW (ref 135–145)
Total Bilirubin: 1.1 mg/dL (ref 0.3–1.2)
Total Protein: 6.6 g/dL (ref 6.5–8.1)

## 2021-08-06 LAB — RESP PANEL BY RT-PCR (FLU A&B, COVID) ARPGX2
Influenza A by PCR: NEGATIVE
Influenza B by PCR: NEGATIVE
SARS Coronavirus 2 by RT PCR: NEGATIVE

## 2021-08-06 MED ORDER — TRANEXAMIC ACID-NACL 1000-0.7 MG/100ML-% IV SOLN
INTRAVENOUS | Status: AC
  Administered 2021-08-06: 1000 mg
  Filled 2021-08-06: qty 100

## 2021-08-06 MED ORDER — OXYTOCIN BOLUS FROM INFUSION
333.0000 mL | Freq: Once | INTRAVENOUS | Status: AC
  Administered 2021-08-06: 333 mL via INTRAVENOUS

## 2021-08-06 MED ORDER — ACETAMINOPHEN 325 MG PO TABS: 650.0000 mg | ORAL_TABLET | ORAL | Status: DC | PRN

## 2021-08-06 MED ORDER — EPHEDRINE 5 MG/ML INJ: 10.0000 mg | INTRAVENOUS | Status: DC | PRN

## 2021-08-06 MED ORDER — LACTATED RINGERS IV SOLN
INTRAVENOUS | Status: DC
Start: 2021-08-06 — End: 2021-08-06

## 2021-08-06 MED ORDER — MEDROXYPROGESTERONE ACETATE 150 MG/ML IM SUSP: 150.0000 mg | INTRAMUSCULAR | Status: DC | PRN

## 2021-08-06 MED ORDER — COCONUT OIL OIL: 1.0000 "application " | TOPICAL_OIL | Status: DC | PRN

## 2021-08-06 MED ORDER — SOD CITRATE-CITRIC ACID 500-334 MG/5ML PO SOLN: 30.0000 mL | ORAL | Status: DC | PRN

## 2021-08-06 MED ORDER — FENTANYL-BUPIVACAINE-NACL 0.5-0.125-0.9 MG/250ML-% EP SOLN
12.0000 mL/h | EPIDURAL | Status: DC | PRN
  Filled 2021-08-06: qty 250

## 2021-08-06 MED ORDER — METHYLERGONOVINE MALEATE 0.2 MG/ML IJ SOLN
0.2000 mg | Freq: Once | INTRAMUSCULAR | Status: AC
  Administered 2021-08-06: 0.2 mg via INTRAMUSCULAR

## 2021-08-06 MED ORDER — DIPHENHYDRAMINE HCL 25 MG PO CAPS
25.0000 mg | ORAL_CAPSULE | Freq: Once | ORAL | Status: AC
  Administered 2021-08-06: 25 mg via ORAL
  Filled 2021-08-06: qty 1

## 2021-08-06 MED ORDER — PHENYLEPHRINE 40 MCG/ML (10ML) SYRINGE FOR IV PUSH (FOR BLOOD PRESSURE SUPPORT)
80.0000 ug | PREFILLED_SYRINGE | INTRAVENOUS | Status: DC | PRN
Start: 2021-08-06 — End: 2021-08-06
  Filled 2021-08-06: qty 10

## 2021-08-06 MED ORDER — FENTANYL CITRATE (PF) 100 MCG/2ML IJ SOLN
100.0000 ug | INTRAMUSCULAR | Status: DC | PRN
  Administered 2021-08-06: 100 ug via INTRAVENOUS
  Filled 2021-08-06: qty 2

## 2021-08-06 MED ORDER — ONDANSETRON HCL 4 MG/2ML IJ SOLN: 4.0000 mg | Freq: Four times a day (QID) | INTRAMUSCULAR | Status: DC | PRN

## 2021-08-06 MED ORDER — TERBUTALINE SULFATE 1 MG/ML IJ SOLN: 0.2500 mg | Freq: Once | INTRAMUSCULAR | Status: DC | PRN

## 2021-08-06 MED ORDER — LIDOCAINE HCL (PF) 1 % IJ SOLN
30.0000 mL | INTRAMUSCULAR | Status: DC | PRN
Start: 2021-08-06 — End: 2021-08-06

## 2021-08-06 MED ORDER — MEASLES, MUMPS & RUBELLA VAC IJ SOLR: 0.5000 mL | Freq: Once | INTRAMUSCULAR | Status: DC

## 2021-08-06 MED ORDER — OXYCODONE-ACETAMINOPHEN 5-325 MG PO TABS
2.0000 | ORAL_TABLET | ORAL | Status: DC | PRN
Start: 2021-08-06 — End: 2021-08-06

## 2021-08-06 MED ORDER — DIPHENHYDRAMINE HCL 25 MG PO CAPS: 25.0000 mg | ORAL_CAPSULE | Freq: Four times a day (QID) | ORAL | Status: DC | PRN

## 2021-08-06 MED ORDER — DIPHENHYDRAMINE HCL 50 MG/ML IJ SOLN: 12.5000 mg | INTRAMUSCULAR | Status: DC | PRN

## 2021-08-06 MED ORDER — ONDANSETRON HCL 4 MG PO TABS: 4.0000 mg | ORAL_TABLET | ORAL | Status: DC | PRN

## 2021-08-06 MED ORDER — BENZOCAINE-MENTHOL 20-0.5 % EX AERO
1.0000 "application " | INHALATION_SPRAY | CUTANEOUS | Status: DC | PRN
  Administered 2021-08-07: 1 via TOPICAL
  Filled 2021-08-06: qty 56

## 2021-08-06 MED ORDER — FENTANYL-BUPIVACAINE-NACL 0.5-0.125-0.9 MG/250ML-% EP SOLN
EPIDURAL | Status: DC | PRN
  Administered 2021-08-06: 12 mL/h via EPIDURAL

## 2021-08-06 MED ORDER — OXYCODONE-ACETAMINOPHEN 5-325 MG PO TABS: 1.0000 | ORAL_TABLET | ORAL | Status: DC | PRN

## 2021-08-06 MED ORDER — TETANUS-DIPHTH-ACELL PERTUSSIS 5-2.5-18.5 LF-MCG/0.5 IM SUSY: 0.5000 mL | PREFILLED_SYRINGE | Freq: Once | INTRAMUSCULAR | Status: DC

## 2021-08-06 MED ORDER — OXYTOCIN-SODIUM CHLORIDE 30-0.9 UT/500ML-% IV SOLN: 1.0000 m[IU]/min | INTRAVENOUS | Status: DC

## 2021-08-06 MED ORDER — TRANEXAMIC ACID-NACL 1000-0.7 MG/100ML-% IV SOLN: 1000.0000 mg | INTRAVENOUS | Status: DC

## 2021-08-06 MED ORDER — ZOLPIDEM TARTRATE 5 MG PO TABS: 5.0000 mg | ORAL_TABLET | Freq: Every evening | ORAL | Status: DC | PRN

## 2021-08-06 MED ORDER — PHENYLEPHRINE 40 MCG/ML (10ML) SYRINGE FOR IV PUSH (FOR BLOOD PRESSURE SUPPORT): 80.0000 ug | PREFILLED_SYRINGE | INTRAVENOUS | Status: DC | PRN

## 2021-08-06 MED ORDER — SIMETHICONE 80 MG PO CHEW: 80.0000 mg | CHEWABLE_TABLET | ORAL | Status: DC | PRN

## 2021-08-06 MED ORDER — LACTATED RINGERS IV SOLN: 500.0000 mL | INTRAVENOUS | Status: DC | PRN

## 2021-08-06 MED ORDER — OXYTOCIN-SODIUM CHLORIDE 30-0.9 UT/500ML-% IV SOLN
2.5000 [IU]/h | INTRAVENOUS | Status: DC
Start: 2021-08-06 — End: 2021-08-06
  Filled 2021-08-06: qty 500

## 2021-08-06 MED ORDER — LACTATED RINGERS IV SOLN
500.0000 mL | Freq: Once | INTRAVENOUS | Status: AC
  Administered 2021-08-06: 500 mL via INTRAVENOUS

## 2021-08-06 MED ORDER — PRENATAL MULTIVITAMIN CH
1.0000 | ORAL_TABLET | Freq: Every day | ORAL | Status: DC
  Administered 2021-08-07 – 2021-08-08 (×2): 1 via ORAL
  Filled 2021-08-06 (×2): qty 1

## 2021-08-06 MED ORDER — DIBUCAINE (PERIANAL) 1 % EX OINT: 1.0000 "application " | TOPICAL_OINTMENT | CUTANEOUS | Status: DC | PRN

## 2021-08-06 MED ORDER — SENNOSIDES-DOCUSATE SODIUM 8.6-50 MG PO TABS
2.0000 | ORAL_TABLET | Freq: Every day | ORAL | Status: DC
  Administered 2021-08-07 – 2021-08-08 (×2): 2 via ORAL
  Filled 2021-08-06 (×2): qty 2

## 2021-08-06 MED ORDER — WITCH HAZEL-GLYCERIN EX PADS: 1.0000 "application " | MEDICATED_PAD | CUTANEOUS | Status: DC | PRN

## 2021-08-06 MED ORDER — METHYLERGONOVINE MALEATE 0.2 MG/ML IJ SOLN
INTRAMUSCULAR | Status: AC
  Filled 2021-08-06: qty 1

## 2021-08-06 MED ORDER — ONDANSETRON HCL 4 MG/2ML IJ SOLN: 4.0000 mg | INTRAMUSCULAR | Status: DC | PRN

## 2021-08-06 MED ORDER — LIDOCAINE HCL (PF) 1 % IJ SOLN
INTRAMUSCULAR | Status: DC | PRN
  Administered 2021-08-06: 2 mL via EPIDURAL
  Administered 2021-08-06: 10 mL via EPIDURAL

## 2021-08-06 MED ORDER — IBUPROFEN 600 MG PO TABS
600.0000 mg | ORAL_TABLET | Freq: Four times a day (QID) | ORAL | Status: DC
  Administered 2021-08-06 – 2021-08-08 (×7): 600 mg via ORAL
  Filled 2021-08-06 (×7): qty 1

## 2021-08-06 NOTE — MAU Note (Signed)
.  Rachel Heath is a 30 y.o. at [redacted]w[redacted]d here in MAU reporting: ctx ongoing since last night that have grown in intensity. They are now x4 minutes apart. Having bloody show. Denies LOF. Endorses good fetal movement. No HA or blurry vision.   Pain score: 10 Vitals:   08/06/21 1237  BP: (!) 131/91  Pulse: 90  Resp: 17  Temp: 99 F (37.2 C)  SpO2: 100%     FHT:135

## 2021-08-06 NOTE — Anesthesia Procedure Notes (Signed)
Epidural Patient location during procedure: OB Start time: 08/06/2021 3:27 PM End time: 08/06/2021 3:34 PM  Staffing Anesthesiologist: Lannie Fields, DO Performed: anesthesiologist   Preanesthetic Checklist Completed: patient identified, IV checked, risks and benefits discussed, monitors and equipment checked, pre-op evaluation and timeout performed  Epidural Patient position: sitting Prep: DuraPrep and site prepped and draped Patient monitoring: continuous pulse ox, blood pressure, heart rate and cardiac monitor Approach: midline Location: L3-L4 Injection technique: LOR air  Needle:  Needle type: Tuohy  Needle gauge: 17 G Needle length: 9 cm Needle insertion depth: 7 cm Catheter type: closed end flexible Catheter size: 19 Gauge Catheter at skin depth: 12 cm Test dose: negative  Assessment Sensory level: T8 Events: blood not aspirated, injection not painful, no injection resistance, no paresthesia and negative IV test  Additional Notes Patient identified. Risks/Benefits/Options discussed with patient including but not limited to bleeding, infection, nerve damage, paralysis, failed block, incomplete pain control, headache, blood pressure changes, nausea, vomiting, reactions to medication both or allergic, itching and postpartum back pain. Confirmed with bedside nurse the patient's most recent platelet count. Confirmed with patient that they are not currently taking any anticoagulation, have any bleeding history or any family history of bleeding disorders. Patient expressed understanding and wished to proceed. All questions were answered. Sterile technique was used throughout the entire procedure. Please see nursing notes for vital signs. Test dose was given through epidural catheter and negative prior to continuing to dose epidural or start infusion. Warning signs of high block given to the patient including shortness of breath, tingling/numbness in hands, complete motor  block, or any concerning symptoms with instructions to call for help. Patient was given instructions on fall risk and not to get out of bed. All questions and concerns addressed with instructions to call with any issues or inadequate analgesia.  Reason for block:procedure for pain

## 2021-08-06 NOTE — H&P (Signed)
OBSTETRIC ADMISSION HISTORY AND PHYSICAL  Rachel Heath is a 30 y.o. female [phone] with IUP at [redacted]w[redacted]d by LMP presenting for SOL at [redacted]w[redacted]d. She reports +FMs, No LOF, no VB, no blurry vision, headaches or peripheral edema, and RUQ pain.  She plans on breast feeding. She request outpatient nexplanon for birth control. She received her prenatal care at  [facility]    Dating: By LMP --->  Estimated Date of Delivery: 08/16/21  Sono:    @[redacted]w[redacted]d , CWD, normal anatomy, breech presentation, 266g, 34% EFW  Three prior vaginal deliveries, received buccal cytotec for postpartum bleeding with last baby. Per chart review, EBL 557 mL.   Prenatal History/Complications: Sickle cell trait (spouse is not a trait carrier); Rh neg ; Heart murmur with normal echo; obesity  Past Medical History: Past Medical History:  Diagnosis Date   Ganglion cyst    right hand   Heart murmur    per parent   Sickle cell trait (HCC)     Past Surgical History: Past Surgical History:  Procedure Laterality Date   HAND SURGERY      Obstetrical History: OB History     Gravida  6   Para  3   Term  3   Preterm      AB  2   Living  3      SAB  1   IAB  1   Ectopic      Multiple  0   Live Births  3           Social History Social History   Socioeconomic History   Marital status: Single    Spouse name: Not on file   Number of children: Not on file   Years of education: Not on file   Highest education level: Not on file  Occupational History   Not on file  Tobacco Use   Smoking status: Former    Types: Cigars    Quit date: 05/19/2016    Years since quitting: 5.2   Smokeless tobacco: Never  Vaping Use   Vaping Use: Never used  Substance and Sexual Activity   Alcohol use: No   Drug use: No    Frequency: 3.0 times per week   Sexual activity: Yes    Birth control/protection: None    Comment: Pregnant   Other Topics Concern   Not on file  Social History Narrative   Not on file   Social  Determinants of Health   Financial Resource Strain: Not on file  Food Insecurity: Not on file  Transportation Needs: Not on file  Physical Activity: Not on file  Stress: Not on file  Social Connections: Not on file    Family History: Family History  Problem Relation Age of Onset   Hypertension Mother    Heart disease Maternal Grandfather     Allergies: No Known Allergies  Medications Prior to Admission  Medication Sig Dispense Refill Last Dose   Prenatal Vit w/Fe-Methylfol-FA (PNV PO) Take by mouth.   08/06/2021   butalbital-acetaminophen-caffeine (FIORICET) 50-325-40 MG tablet Take 1-2 tablets by mouth every 6 (six) hours as needed for headache. (Patient not taking: Reported on 07/06/2021) 20 tablet 0    metoCLOPramide (REGLAN) 10 MG tablet Take 1 tablet (10 mg total) by mouth 3 (three) times daily with meals as needed for nausea. 30 tablet 0      Review of Systems   All systems reviewed and negative except as stated in HPI  Blood pressure  125/76, pulse 83, temperature 99 F (37.2 C), temperature source Oral, resp. rate 17, last menstrual period 11/09/2020, SpO2 97 %, unknown if currently breastfeeding. General appearance: alert, cooperative, and no distress Lungs: clear to auscultation bilaterally Heart: regular rate and rhythm, 2/6 systolic murmur Abdomen: soft, non-tender; bowel sounds normal Extremities: Homans sign is negative, no sign of DVT Presentation: cephalic Fetal monitoringBaseline: 135 bpm, Variability: Good {> 6 bpm), and Accelerations: Reactive Uterine activity Contractions for 3  days, stronger today. Spaced 5-6 mminutes apart.  Dilation: 3.5 Effacement (%): 60 Station: -3 Exam by:: [employer] RN   Prenatal labs: ABO, Rh: --/--/O NEG (08/18 1307) Antibody: POS (08/18 1307) Rubella: 3.28 (02/09 1127) RPR: Non Reactive (06/06 1124)  HBsAg: Negative (02/09 1127)  HIV: Non Reactive (06/06 1124)  GBS: Negative/-- (08/08 1109)  1 hr Glucola  WNL Genetic screening  low risk NIPS Anatomy US normal  Prenatal Transfer Tool  Maternal Diabetes: No Genetic Screening: Normal Maternal Ultrasounds/Referrals: Normal Fetal Ultrasounds or other Referrals:  None Maternal Substance Abuse:  No Significant Maternal Medications:  None Significant Maternal Lab Results: Group B Strep negative and Rh negative  Results for orders placed or performed during the hospital encounter of 08/06/21 (from the past 24 hour(s))  Resp Panel by RT-PCR (Flu A&B, Covid) Nasopharyngeal Swab   Collection Time: 08/06/21  1:07 PM   Specimen: Nasopharyngeal Swab; Nasopharyngeal(NP) swabs in vial transport medium  Result Value Ref Range   SARS Coronavirus 2 by RT PCR NEGATIVE NEGATIVE   Influenza A by PCR NEGATIVE NEGATIVE   Influenza B by PCR NEGATIVE NEGATIVE  CBC   Collection Time: 08/06/21  1:07 PM  Result Value Ref Range   WBC 11.2 (H) 4.0 - 10.5 K/uL   RBC 3.85 (L) 3.87 - 5.11 MIL/uL   Hemoglobin 10.6 (L) 12.0 - 15.0 g/dL   HCT 00.9 (L) 38.1 - 82.9 %   MCV 80.8 80.0 - 100.0 fL   MCH 27.5 26.0 - 34.0 pg   MCHC 34.1 30.0 - 36.0 g/dL   RDW 93.7 16.9 - 67.8 %   Platelets 355 150 - 400 K/uL   nRBC 0.0 0.0 - 0.2 %  Type and screen [facility]   Collection Time: 08/06/21  1:07 PM  Result Value Ref Range   ABO/RH(D) O NEG    Antibody Screen POS    Sample Expiration      08/09/2021,2359 Performed at [facility], [address], [place], [place] [address]   Results for orders placed or performed during the hospital encounter of 08/05/21 (from the past 24 hour(s))  Protein / creatinine ratio, urine   Collection Time: 08/05/21 10:56 PM  Result Value Ref Range   Creatinine, Urine 45.87 mg/dL   Total Protein, Urine 11 mg/dL   Protein Creatinine Ratio 0.24 (H) 0.00 - 0.15 mg/mg[Cre]  CBC   Collection Time: 08/05/21 11:32 PM  Result Value Ref Range   WBC 12.1 (H) 4.0 - 10.5 K/uL   RBC 3.75 (L) 3.87 - 5.11 MIL/uL   Hemoglobin  10.4 (L) 12.0 - 15.0 g/dL   HCT 17.5 (L) 10.2 - 58.5 %   MCV 80.3 80.0 - 100.0 fL   MCH 27.7 26.0 - 34.0 pg   MCHC 34.6 30.0 - 36.0 g/dL   RDW 27.7 82.4 - 23.5 %   Platelets 330 150 - 400 K/uL   nRBC 0.0 0.0 - 0.2 %  Comprehensive metabolic panel   Collection Time: 08/05/21 11:32 PM  Result Value Ref Range   Sodium 134 (L) 135 - 145 mmol/L   Potassium 3.1 (L) 3.5 - 5.1 mmol/L   Chloride 103 98 - 111 mmol/L   CO2 22 22 - 32 mmol/L   Glucose, Bld 85 70 - 99 mg/dL   BUN <5 (L) 6 - 20 mg/dL   Creatinine, Ser 4.78 0.44 - 1.00 mg/dL   Calcium 8.7 (L) 8.9 - 10.3 mg/dL   Total Protein 6.6 6.5 - 8.1 g/dL   Albumin 2.9 (L) 3.5 - 5.0 g/dL   AST 16 15 - 41 U/L   ALT 13 0 - 44 U/L   Alkaline Phosphatase 136 (H) 38 - 126 U/L   Total Bilirubin 1.1 0.3 - 1.2 mg/dL   GFR, Estimated >29 >56 mL/min   Anion gap 9 5 - 15    Patient Active Problem List   Diagnosis Date Noted   High serum vitamin D 02/16/2021   Urinary tract infection in pregnancy, antepartum, first trimester 01/31/2021   Obesity in pregnancy 01/28/2021   Supervision of normal pregnancy, antepartum 12/17/2020   Heart murmur    Family history of sickle cell trait    Sickle cell trait (HCC) 10/25/2016   Rh negative state in antepartum period 10/25/2016    Assessment/Plan:  Rachel Heath is a 30 y.o. [id number] at [redacted]w[redacted]d here for spontaneous onset of labor.   #Labor: Progressing spontaneously #Pain: PRN, desires epidural #FWB: Cat I #ID:  GBS neg #MOF: Breast #MOC:Interval nexplanon #Circ:  Yes  Dorothyann Gibbs, MD  08/06/2021, 2:51 PM

## 2021-08-06 NOTE — Plan of Care (Signed)
Rosamund Nyland, RN 

## 2021-08-06 NOTE — Progress Notes (Signed)
Labor Progress Note Rachel Heath is a 30 y.o. S8P1031 at [redacted]w[redacted]d presented for Spontaneous onset of labor.  S: Feeling increased pressure low in pelvis. Amenable to AROM.   O:  BP 104/67   Pulse 79   Temp 98.4 F (36.9 C) (Oral)   Resp 16   LMP 11/09/2020   SpO2 100%  EFM: 125/moderate/no decels  CVE: Dilation: Lip/rim Effacement (%): 90 Cervical Position: Posterior Station: 0 Presentation: Vertex Exam by:: Dr. Marisue Humble AROM at 1722 with clear fluid. Head descended to +1 station.   A&P: 30 y.o. [id number] [redacted]w[redacted]d  #Labor: Progressing well. Had previously gotten to 8/thin/0. AROM caused head descent and pushed thin cervical ring out of the way.  #Pain: Epidural. Good pain control #FWB: Cat I #GBS negative - Rh negative, may need rhogam after delivery   Dorothyann Gibbs, MD 5:37 PM

## 2021-08-06 NOTE — Discharge Summary (Signed)
Postpartum Discharge Summary    Patient Name: Rachel Heath DOB: May 28, 1991 MRN: [id number]  Date of admission: 08/06/2021 Delivery date:08/06/2021  Delivering provider: Renard Matter  Date of discharge: 08/08/2021  Admitting diagnosis: Gestational hypertension [O13.9] Intrauterine pregnancy: [redacted]w[redacted]d    Secondary diagnosis:  Active Problems:   Family history of sickle cell trait   Supervision of normal pregnancy, antepartum   Vaginal delivery  Additional problems: None    Discharge diagnosis: Term Pregnancy Delivered                                              Post partum procedures:None Augmentation: AROM Complications: None  Hospital course: Onset of Labor With Vaginal Delivery      30y.o. yo [id number] admitted in Active Labor on 08/06/2021. Patient had an uncomplicated labor course as follows:  Membrane Rupture Time/Date: 5:22 PM ,08/06/2021   Delivery Method:Vaginal, Spontaneous  Episiotomy: None  Lacerations:  2nd degree;Perineal  Patient had an uncomplicated postpartum course.  She is ambulating, tolerating a regular diet, passing flatus, and urinating well. Patient is discharged home in stable condition on 08/08/21.  Newborn Data: Birth date:08/06/2021  Birth time:5:48 PM  Gender:Female  Living status:Living  Apgars:9 ,9  Weight:3141 g   Magnesium Sulfate received: No BMZ received: No Rhophylac:Yes MMR:N/A T-DaP:Given prenatally Flu: N/A Transfusion:No  Physical exam  Vitals:   08/07/21 0131 08/07/21 0533 08/07/21 0849 08/08/21 0535  BP: 132/81 116/80 115/75 125/80  Pulse: 76 63 68 70  Resp: _0 Temp: 98.5 F (36.9 C) 98.4 F (36.9 C) 97.9 F (36.6 C) 97.8 F (36.6 C)  TempSrc: Oral  Oral Oral  SpO2: 100% 100% 100% 99%   General: alert, cooperative, and no distress Lochia: appropriate Uterine Fundus: firm DVT Evaluation: No evidence of DVT seen on physical exam. Labs: Lab Results  Component Value Date   WBC 11.2 (H) 08/06/2021    HGB 10.6 (L) 08/06/2021   HCT 31.1 (L) 08/06/2021   MCV 80.8 08/06/2021   PLT 355 08/06/2021   CMP Latest Ref Rng & Units 08/05/2021  Glucose 70 - 99 mg/dL 85  BUN 6 - 20 mg/dL <5(L)  Creatinine 0.44 - 1.00 mg/dL 0.55  Sodium 135 - 145 mmol/L 134(L)  Potassium 3.5 - 5.1 mmol/L 3.1(L)  Chloride 98 - 111 mmol/L 103  CO2 22 - 32 mmol/L 22  Calcium 8.9 - 10.3 mg/dL 8.7(L)  Total Protein 6.5 - 8.1 g/dL 6.6  Total Bilirubin 0.3 - 1.2 mg/dL 1.1  Alkaline Phos 38 - 126 U/L 136(H)  AST 15 - 41 U/L 16  ALT 0 - 44 U/L 13   Edinburgh Score: Edinburgh Postnatal Depression Scale Screening Tool 08/07/2021  I have been able to laugh and see the funny side of things. 0  I have looked forward with enjoyment to things. 0  I have blamed myself unnecessarily when things went wrong. 0  I have been anxious or worried for no good reason. 0  I have felt scared or panicky for no good reason. 0  Things have been getting on top of me. 0  I have been so unhappy that I have had difficulty sleeping. 0  I have felt sad or miserable. 0  I have been so unhappy that I have been crying. 0  The thought of harming myself  has occurred to me. 0  Edinburgh Postnatal Depression Scale Total 0     After visit meds:  Allergies as of 08/08/2021   No Known Allergies      Medication List     STOP taking these medications    butalbital-acetaminophen-caffeine 50-325-40 MG tablet Commonly known as: FIORICET   metoCLOPramide 10 MG tablet Commonly known as: Reglan   PNV PO       TAKE these medications    acetaminophen 325 MG tablet Commonly known as: Tylenol Take 2 tablets (650 mg total) by mouth every 4 (four) hours as needed (for pain scale < 4). What changed:  medication strength how much to take when to take this reasons to take this   benzocaine-Menthol 20-0.5 % Aero Commonly known as: DERMOPLAST Apply 1 application topically as needed for irritation (perineal discomfort).   coconut oil  Oil Apply 1 application topically as needed.   ibuprofen 600 MG tablet Commonly known as: ADVIL Take 1 tablet (600 mg total) by mouth every 6 (six) hours.   prenatal multivitamin Tabs tablet Take 1 tablet by mouth daily at 12 noon.   rho (d) immune globulin 1500 UNIT/2ML Sosy injection Commonly known as: RHIG/RHOPHYLAC Inject 2 mLs (300 mcg total) into the muscle once for 1 dose.   witch hazel-glycerin pad Commonly known as: TUCKS Apply 1 application topically as needed for hemorrhoids.         Discharge home in stable condition Infant Feeding: Breast Infant Disposition:home with mother Discharge instruction: per After Visit Summary and Postpartum booklet. Activity: Advance as tolerated. Pelvic rest for 6 weeks.  Diet: routine diet Future Appointments: Future Appointments  Date Time Provider [place]  08/13/2021 10:20 AM [place] None  09/10/2021 10:15 AM Shelly Bombard, MD [facility] None   Follow up Visit: Message sent to [facility] on 8/18 by Dr. Cy Blamer  Please schedule this patient for a In person postpartum visit in 4 weeks with the following provider: MD. Additional Postpartum F/U: 1 wk BP check High risk pregnancy complicated by: HTN Delivery mode:  Vaginal, Spontaneous  Anticipated Birth Control:   Nexplanon outpatient   08/08/2021 Pearla Dubonnet, MD

## 2021-08-06 NOTE — Anesthesia Preprocedure Evaluation (Signed)
Anesthesia Evaluation  Patient identified by MRN, date of birth, ID band Patient awake    Reviewed: Allergy & Precautions, Patient's Chart, lab work & pertinent test results  Airway Mallampati: II  TM Distance: >3 FB Neck ROM: Full    Dental no notable dental hx.    Pulmonary former smoker,    Pulmonary exam normal breath sounds clear to auscultation       Cardiovascular negative cardio ROS Normal cardiovascular exam Rhythm:Regular Rate:Normal     Neuro/Psych negative neurological ROS  negative psych ROS   GI/Hepatic negative GI ROS, Neg liver ROS,   Endo/Other  Obesity BMI 33  Renal/GU negative Renal ROS  negative genitourinary   Musculoskeletal negative musculoskeletal ROS (+)   Abdominal (+) + obese,   Peds negative pediatric ROS (+)  Hematology  (+) Blood dyscrasia, Sickle cell trait and anemia , hct 31.1, plt 355   Anesthesia Other Findings   Reproductive/Obstetrics (+) Pregnancy 4th pregnancy                             Anesthesia Physical Anesthesia Plan  ASA: 2  Anesthesia Plan: Epidural   Post-op Pain Management:    Induction:   PONV Risk Score and Plan: 2  Airway Management Planned: Natural Airway  Additional Equipment: None  Intra-op Plan:   Post-operative Plan:   Informed Consent: I have reviewed the patients History and Physical, chart, labs and discussed the procedure including the risks, benefits and alternatives for the proposed anesthesia with the patient or authorized representative who has indicated his/her understanding and acceptance.       Plan Discussed with:   Anesthesia Plan Comments:         Anesthesia Quick Evaluation

## 2021-08-07 ENCOUNTER — Ambulatory Visit: Payer: Medicaid Other

## 2021-08-07 LAB — TYPE AND SCREEN
ABO/RH(D): O NEG
Antibody Screen: POSITIVE

## 2021-08-07 LAB — RPR: RPR Ser Ql: NONREACTIVE

## 2021-08-07 NOTE — Lactation Note (Signed)
This note was copied from a baby's chart. Lactation Consultation Note  Patient Name: Rachel Heath [id number] Date: 08/07/2021 Reason for consult: Initial assessment Age:28 hours  P4 mother reports that she breastfed for 5-6 months with 2nd child and 1 month with 3rd, she hopes to breastfeed for 1 yr or longer with this child.  Mother reports that infant latches well but she is having trouble with positioning infant at the breast. Assist mother with  placing infant in cross cradle hold. Infant latched well with good depth. Lips flanged. Obs swallows.  Mother reports that position and latch felt much better.  Discussed football hold and rotating positions.  Advised mother to cue base feed infant and to continue with STS . Discussed cluster feeding.  Mother was given [facility] brochure.   Maternal Data Has patient been taught Hand Expression?: Yes Does the patient have breastfeeding experience prior to this delivery?: Yes How long did the patient breastfeed?: 5-6 months with 2nd and 1 month with 3rd  Feeding Mother's Current Feeding Choice: Breast Milk  LATCH Score Latch: Grasps breast easily, tongue down, lips flanged, rhythmical sucking.  Audible Swallowing: A few with stimulation  Type of Nipple: Everted at rest and after stimulation  Comfort (Breast/Nipple): Soft / non-tender  Hold (Positioning): Assistance needed to correctly position infant at breast and maintain latch.  LATCH Score: 8   Lactation Tools Discussed/Used    Interventions Interventions: Breast feeding basics reviewed;Assisted with latch;Skin to skin;Hand express;Breast compression;Adjust position;Support pillows;Position options;Education  Discharge WIC Program: Yes  Consult Status Consult Status: Follow-up Date: 08/08/21 Follow-up type: In-patient    Stevan Born [facility] 08/07/2021, 9:44 AM

## 2021-08-07 NOTE — Progress Notes (Signed)
Circumcision Consent  Discussed with mom at bedside about circumcision.   Circumcision is a surgery that removes the skin that covers the tip of the penis, called the "foreskin." Circumcision is usually done when a boy is between 1 and 10 days old, sometimes up to 3-4 weeks old.  The most common reasons boys are circumcised include for cultural/religious beliefs or for parental preference (potentially easier to clean, so baby looks like daddy, etc).  There may be some medical benefits for circumcision:   Circumcised boys seem to have slightly lower rates of: ? Urinary tract infections (per the American Academy of Pediatrics an uncircumcised boy has a 1/100 chance of developing a UTI in the first year of life, a circumcised boy at a 12/998 chance of developing a UTI in the first year of life- a 10% reduction) ? Penis cancer (typically rare- an uncircumcised female has a 1 in 100,000 chance of developing cancer of the penis) ? Sexually transmitted infection (in endemic areas, including HIV, HPV and Herpes- circumcision does NOT protect against gonorrhea, chlamydia, trachomatis, or syphilis) ? Phimosis: a condition where that makes retraction of the foreskin over the glans impossible (0.4 per 1000 boys per year or 0.6% of boys are affected by their 15th birthday)  Boys and men who are not circumcised can reduce these extra risks by: ? Cleaning their penis well ? Using condoms during sex  What are the risks of circumcision?  As with any surgical procedure, there are risks and complications. In circumcision, complications are rare and usually minor, the most common being: ? Bleeding- risk is reduced by holding each clamp for 30 seconds prior to a cut being made, and by holding pressure after the procedure is done ? Infection- the penis is cleaned prior to the procedure, and the procedure is done under sterile technique ? Damage to the urethra or amputation of the penis  How is circumcision done  in baby boys?  The baby will be placed on a special table and the legs restrained for their safety. Numbing medication is injected into the penis, and the skin is cleansed with betadine to decrease the risk of infection.   What to expect:  The penis will look red and raw for 5-7 days as it heals. We expect scabbing around where the cut was made, as well as clear-pink fluid and some swelling of the penis right after the procedure. If your baby's circumcision starts to bleed or develops pus, please contact your pediatrician immediately.  All questions were answered and mother consented.  Rachel Heath Obstetrics Fellow  

## 2021-08-07 NOTE — Anesthesia Postprocedure Evaluation (Signed)
Anesthesia Post Note  Patient: Rachel Heath  Procedure(s) Performed: AN AD HOC LABOR EPIDURAL     Patient location during evaluation: Mother Baby Anesthesia Type: Epidural Level of consciousness: awake and alert Pain management: pain level controlled Vital Signs Assessment: post-procedure vital signs reviewed and stable Respiratory status: spontaneous breathing, nonlabored ventilation and respiratory function stable Cardiovascular status: stable Postop Assessment: no headache, no backache, epidural receding, no apparent nausea or vomiting, patient able to bend at knees, adequate PO intake and able to ambulate Anesthetic complications: no   No notable events documented.  Last Vitals:  Vitals:   08/07/21 0533 08/07/21 0849  BP: 116/80 115/75  Pulse: 63 68  Resp: 18 17  Temp: 36.9 C 36.6 C  SpO2: 100% 100%    Last Pain:  Vitals:   08/07/21 0849  TempSrc: Oral  PainSc: 0-No pain   Pain Goal:                   [place] [employer]

## 2021-08-07 NOTE — Progress Notes (Addendum)
Post Partum Day 1 Subjective: no complaints, up ad lib, voiding, tolerating PO, and + flatus  Objective: Blood pressure 116/80, pulse 63, temperature 98.4 F (36.9 C), resp. rate 18, last menstrual period 11/09/2020, SpO2 100 %, unknown if currently breastfeeding.  Physical Exam:  General: alert, cooperative, and no distress Lochia: appropriate Uterine Fundus: firm DVT Evaluation: No evidence of DVT seen on physical exam. Negative Homan's sign. No cords or calf tenderness.  Recent Labs    08/05/21 2332 08/06/21 1307  HGB 10.4* 10.6*  HCT 30.1* 31.1*    Assessment/Plan: Discharge home, Breastfeeding, and Circumcision prior to discharge Will also need Rhogam prior to discharge as baby is O+    LOS: 1 day   Pearla Dubonnet 08/07/2021, 7:49 AM   I personally saw and evaluated the patient, performing the key elements of the service. I developed and verified the management plan that is described in the resident's/student's note, and I agree with the content with my edits above. VSS, HRR&R, Resp unlabored, Legs neg.  Nigel Berthold, CNM 08/10/2021 6:07 PM

## 2021-08-08 ENCOUNTER — Telehealth: Payer: Self-pay | Admitting: Women's Health

## 2021-08-08 DIAGNOSIS — O8620 Urinary tract infection following delivery, unspecified: Secondary | ICD-10-CM | POA: Insufficient documentation

## 2021-08-08 LAB — URINE CULTURE

## 2021-08-08 MED ORDER — WITCH HAZEL-GLYCERIN EX PADS: 1.0000 "application " | MEDICATED_PAD | CUTANEOUS | 12 refills | Status: AC | PRN

## 2021-08-08 MED ORDER — CEFADROXIL 500 MG PO CAPS: 500.0000 mg | ORAL_CAPSULE | Freq: Two times a day (BID) | ORAL | 0 refills | Status: AC

## 2021-08-08 MED ORDER — RHO D IMMUNE GLOBULIN 1500 UNIT/2ML IJ SOSY: 300.0000 ug | PREFILLED_SYRINGE | Freq: Once | INTRAMUSCULAR | 0 refills | Status: AC

## 2021-08-08 MED ORDER — COCONUT OIL OIL: 1.0000 "application " | TOPICAL_OIL | 0 refills | Status: AC | PRN

## 2021-08-08 MED ORDER — PRENATAL MULTIVITAMIN CH: 1.0000 | ORAL_TABLET | Freq: Every day | ORAL | Status: AC

## 2021-08-08 MED ORDER — RHO D IMMUNE GLOBULIN 1500 UNIT/2ML IJ SOSY
300.0000 ug | PREFILLED_SYRINGE | Freq: Once | INTRAMUSCULAR | Status: AC
  Administered 2021-08-08: 300 ug via INTRAMUSCULAR
  Filled 2021-08-08: qty 2

## 2021-08-08 MED ORDER — BENZOCAINE-MENTHOL 20-0.5 % EX AERO: 1.0000 "application " | INHALATION_SPRAY | CUTANEOUS | Status: AC | PRN

## 2021-08-08 MED ORDER — ACETAMINOPHEN 325 MG PO TABS: 650.0000 mg | ORAL_TABLET | ORAL | 0 refills | Status: AC | PRN

## 2021-08-08 MED ORDER — IBUPROFEN 600 MG PO TABS: 600.0000 mg | ORAL_TABLET | Freq: Four times a day (QID) | ORAL | 0 refills | Status: AC

## 2021-08-08 NOTE — Lactation Note (Signed)
This note was copied from a baby's chart. Lactation Consultation Note  Patient Name: Rachel Heath Date: 08/08/2021 Reason for consult: Follow-up assessment Age:28 hours  Mother states she started formula last night due to concerns baby not feeding well after circumcision. Baby cueing.  Assisted with deep latch with intermittent swallows.  Feed on demand with cues.  Goal 8-12+ times per day after first 24 hrs.  Place baby STS if not cueing.  Reviewed engorgement care and monitoring voids/stools.   Maternal Data Has patient been taught Hand Expression?: Yes  Feeding Mother's Current Feeding Choice: Breast Milk and Formula Nipple Type: Extra Slow Flow  LATCH Score Latch: Grasps breast easily, tongue down, lips flanged, rhythmical sucking.  Audible Swallowing: A few with stimulation  Type of Nipple: Everted at rest and after stimulation  Comfort (Breast/Nipple): Soft / non-tender  Hold (Positioning): Assistance needed to correctly position infant at breast and maintain latch.  LATCH Score: 8   Lactation Tools Discussed/Used    Interventions Interventions: Breast feeding basics reviewed;Assisted with latch;Hand express;Education  Discharge Discharge Education: Engorgement and breast care;Warning signs for feeding baby  Consult Status Consult Status: Complete Date: 08/08/21    Dahlia Byes [facility] 08/08/2021, 10:35 AM

## 2021-08-08 NOTE — Telephone Encounter (Signed)
Patient called and identified by two identifiers. Patient with +urine culture in office, but was not treated during hospital stay or upon discharge. Patient reports NKDA. Patient reports is currently taking ibuprofen, Sennakot-S and Tylenol. Patient is currently breastfeeding. RX cefadroxil.   Sherlyn Ebbert, Odie Sera, NP  2:50 PM 08/08/2021

## 2021-08-09 LAB — RH IG WORKUP (INCLUDES ABO/RH)
Fetal Screen: NEGATIVE
Gestational Age(Wks): 38
Unit division: 0

## 2021-08-10 LAB — PTH, INTACT AND CALCIUM
Calcium: 8.8 mg/dL (ref 8.7–10.2)
PTH: 30 pg/mL (ref 15–65)

## 2021-08-10 LAB — VITAMIN D 1,25 DIHYDROXY
Vitamin D 1, 25 (OH)2 Total: 195 pg/mL — ABNORMAL HIGH
Vitamin D2 1, 25 (OH)2: <10 pg/mL
Vitamin D3 1, 25 (OH)2: 194 pg/mL

## 2021-08-12 ENCOUNTER — Encounter: Payer: Medicaid Other | Admitting: Obstetrics

## 2021-08-12 IMAGING — US US MFM OB DETAIL+14 WK
1 series · 13 of 28 positions shown · non-contrast
Comparison: none

[Series 1: us mfm ob detail+14 wk · 124 acquisitions, 13 frames shown]
[im 5/124]
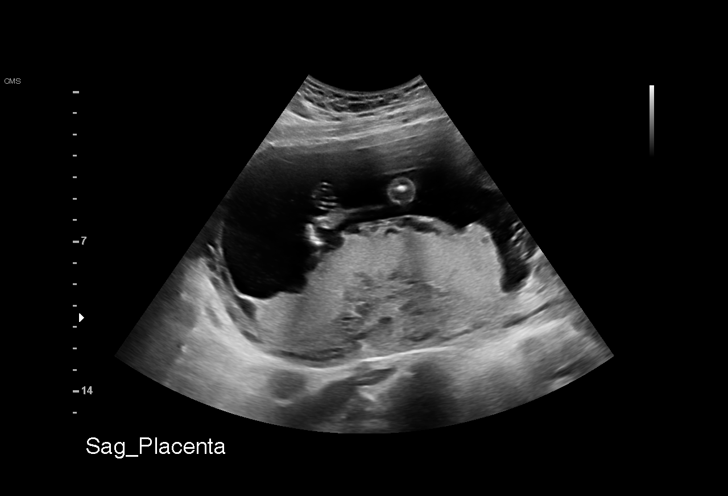
[im 14/124]
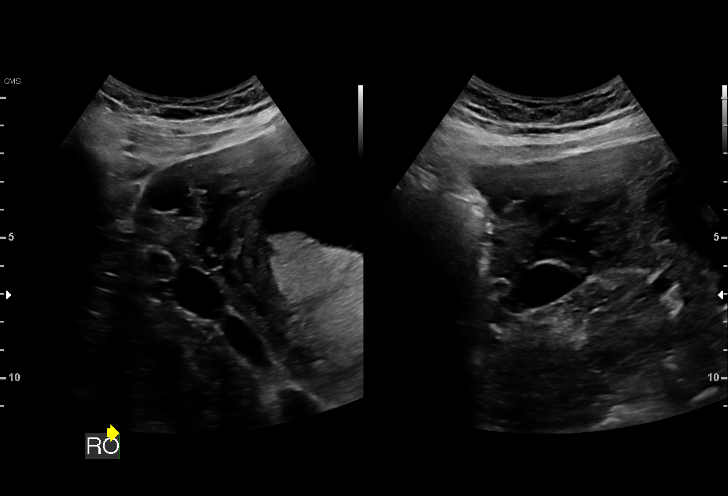
[im 23/124]
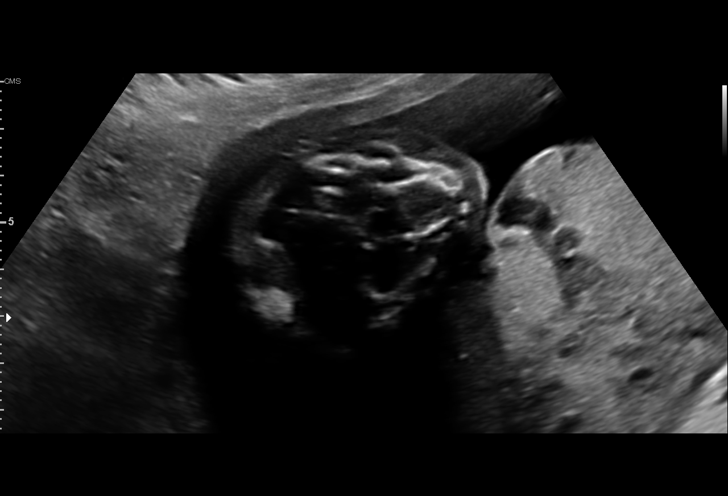
[im 32/124]
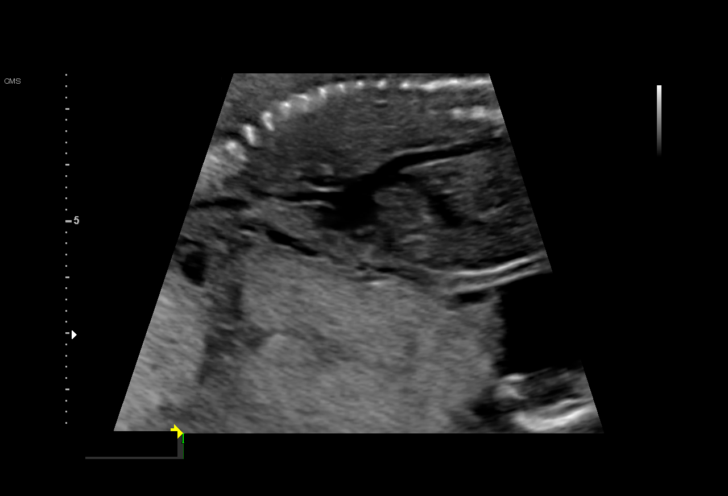
[im 42/124]
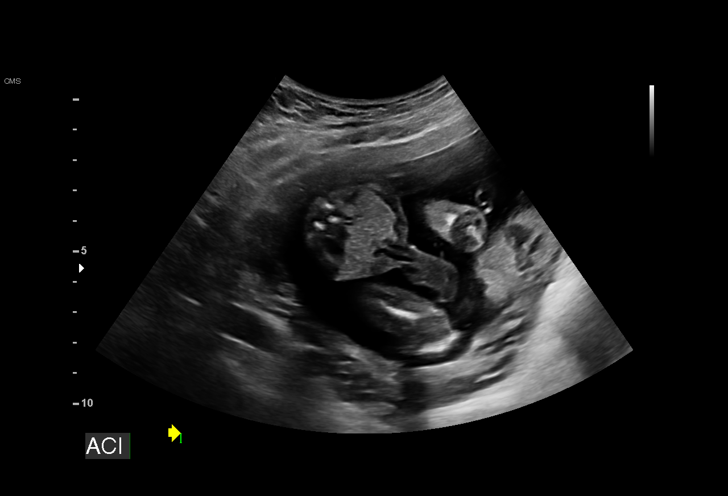
[im 51/124]
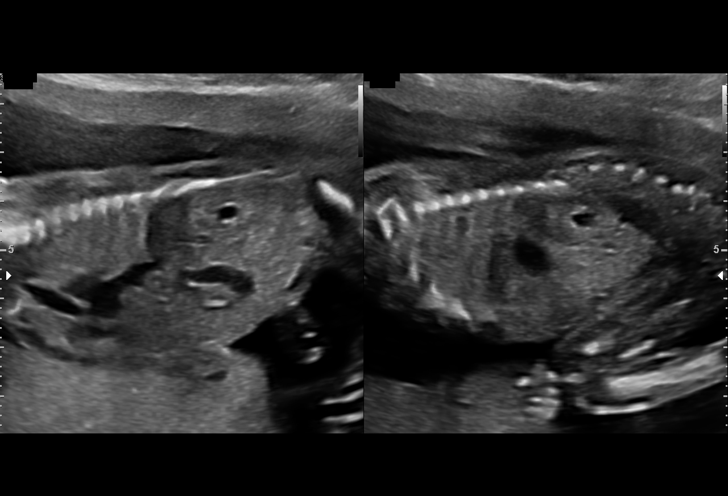
[im 64/124]
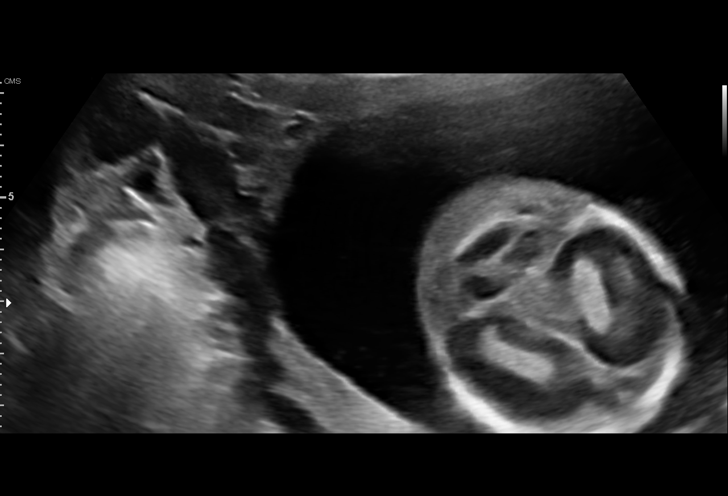
[im 73/124]
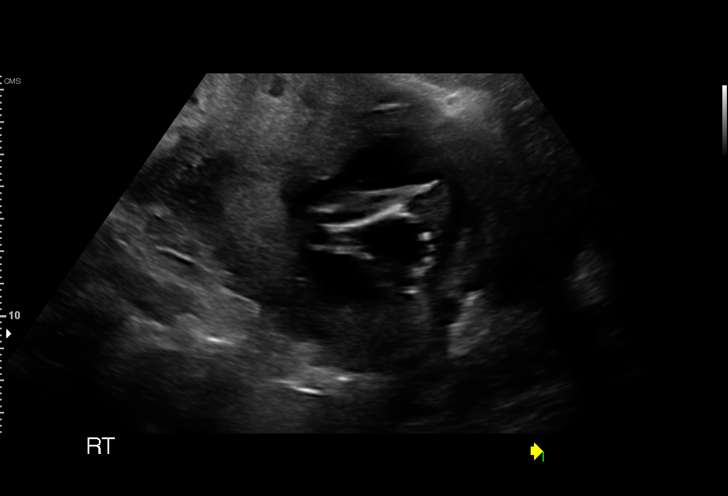
[im 83/124]
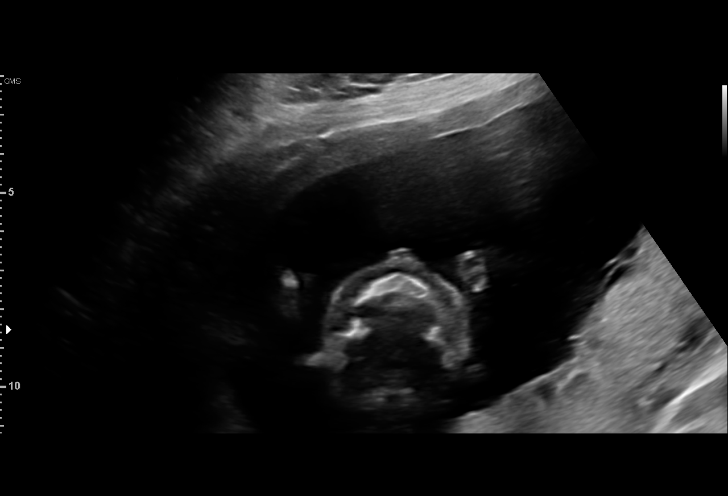
[im 92/124]
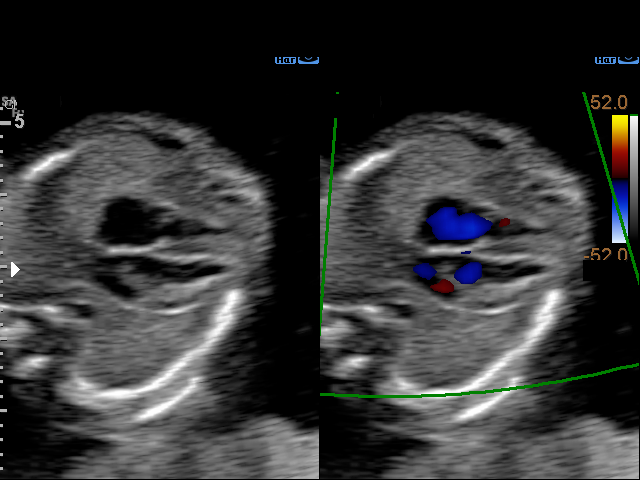
[im 101/124]
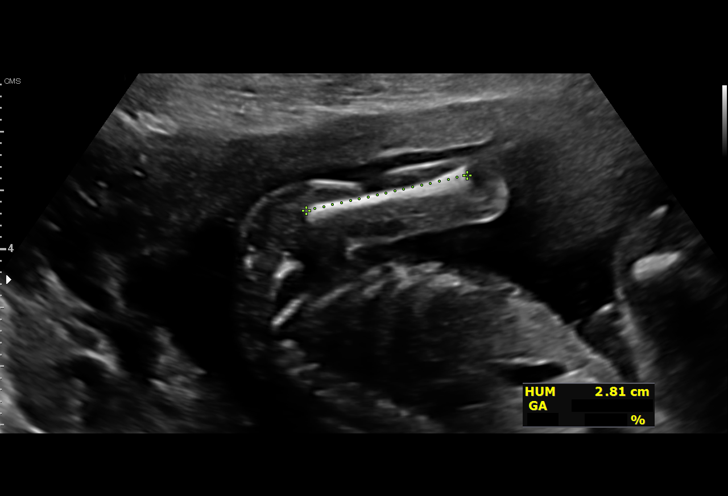
[im 110/124]
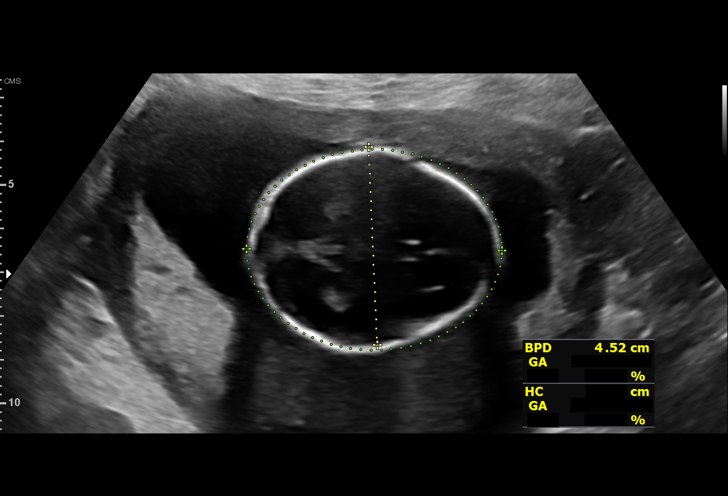
[im 119/124]
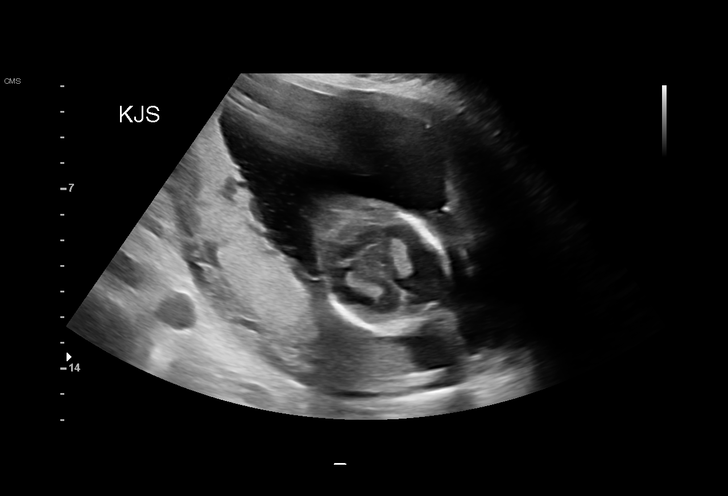

[13 of 28 positions shown; findings below may reference images not displayed]

Indications

 19 weeks gestation of pregnancy
 Antenatal screening for malformations
 History of sickle cell trait (FOB NOT a carrier)
 Rh negative state in antepartum
 Obesity complicating pregnancy, second
 trimester (pregravid BMI 32)
 Heart disease in miother affecting pregnancy
 in second trimester (heart murmur per cards)
 Low risk NIPS
Fetal Evaluation

 Num Of Fetuses:         1
 Fetal Heart Rate(bpm):  145
 Cardiac Activity:       Observed
 Presentation:           Breech
 Placenta:               Posterior
 P. Cord Insertion:      Visualized

 Amniotic Fluid
 AFI FV:      Within normal limits

                             Largest Pocket(cm)

Biometry
 BPD:      45.5  mm     G. Age:  19w 5d         76  %    CI:        75.94   %    70 - 86
                                                         FL/HC:      17.2   %    16.1 -
 HC:      165.5  mm     G. Age:  19w 2d         48  %    HC/AC:      1.22        1.09 -
 AC:      135.8  mm     G. Age:  19w 0d         42  %    FL/BPD:     62.4   %
 FL:       28.4  mm     G. Age:  18w 5d         28  %    FL/AC:      20.9   %    20 - 24
 HUM:        28  mm     G. Age:  19w 0d         47  %
 CER:      19.1  mm     G. Age:  18w 5d         21  %
 NFT:       2.9  mm

 LV:        6.4  mm
 CM:        5.7  mm

 Est. FW:     266  gm      0 lb 9 oz     34  %
OB History

 Gravidity:    6         Term:   3        Prem:   0        SAB:   1
 TOP:          1       Ectopic:  0        Living: 3
Gestational Age

 LMP:           19w 1d        Date:  11/09/20                 EDD:   08/16/21
 U/S Today:     19w 1d                                        EDD:   08/16/21
 Best:          19w 1d     Det. By:  LMP  (11/09/20)          EDD:   08/16/21
Anatomy

 Cranium:               Appears normal         LVOT:                   Appears normal
 Cavum:                 Appears normal         Aortic Arch:            Appears normal
 Ventricles:            Appears normal         Ductal Arch:            Appears normal
 Choroid Plexus:        Appears normal         Diaphragm:              Appears normal
 Cerebellum:            Appears normal         Stomach:                Appears normal, left
                                                                       sided
 Posterior Fossa:       Appears normal         Abdomen:                Appears normal
 Nuchal Fold:           Appears normal         Abdominal Wall:         Appears nml (cord
                                                                       insert, abd wall)
 Face:                  Appears normal         Cord Vessels:           Appears normal (3
                        (orbits and profile)                           vessel cord)
 Lips:                  Appears normal         Kidneys:                Appear normal
 Palate:                Appears normal         Bladder:                Appears normal
 Thoracic:              Appears normal         Spine:                  Appears normal
 Heart:                 Appears normal         Upper Extremities:      Appears normal
                        (4CH, axis, and
                        situs)
 RVOT:                  Appears normal         Lower Extremities:      Appears normal

 Other:  RIGHT heel, LEFT foot and open hands/5th digits visualized. Lenses
         visualized. Nasal bone visualized. VC, 3VV and 3VTV visualized.
         Fetus appears to be a male.
Cervix Uterus Adnexa

 Cervix
 Length:           3.36  cm.
 Normal appearance by transabdominal scan.
 Uterus
 No abnormality visualized.

 Right Ovary
 Within normal limits.

 Left Ovary
 Within normal limits.

 Cul De Sac
 No free fluid seen.

 Adnexa
 No abnormality visualized.
Impression

 G6 P3. Patient is here for fetal anatomy scan.

 On cell-free fetal DNA screening, the risks of fetal
 aneuploidies are not increased .

 Obstetric history is significant for 3 term vaginal deliveries.

 We performed fetal anatomy scan. No makers of
 aneuploidies or fetal structural defects are seen. Fetal
 biometry is consistent with her previously-established dates.
 Amniotic fluid is normal and good fetal activity is seen.
 Patient understands the limitations of ultrasound in detecting
 fetal anomalies.
 Patient has sickle-cell trait but reports her partner (father of
 the baby) does not have sickle-cell trait.
Recommendations

 Follow-up scans as clinically indicated.
                 Rone, Chayo

## 2021-08-13 ENCOUNTER — Ambulatory Visit: Payer: Medicaid Other

## 2021-08-17 ENCOUNTER — Telehealth (HOSPITAL_COMMUNITY): Payer: Self-pay

## 2021-08-17 NOTE — Telephone Encounter (Signed)
"  I'm doing good." Patient declines any questions or concerns about her healing.  "He's doing good. I was having an issue with my milk supply but i'm not anymore. Now I have lots of breastmilk. We went to the pediatrician for weight checks on Monday and Wednesday and he had gained weight. We have another weight check on Friday. He sleeps in a pack in play in the bassinet part."  RN reviewed ABC's of safe sleep with patient. Patient declines any questions or concerns about baby.  EPDS score is 1.  Marcelino Duster [facility] 08/17/2021,1727

## 2021-09-10 ENCOUNTER — Encounter: Payer: Self-pay | Admitting: Obstetrics

## 2021-09-10 ENCOUNTER — Ambulatory Visit (INDEPENDENT_AMBULATORY_CARE_PROVIDER_SITE_OTHER): Payer: Medicaid Other | Admitting: Obstetrics

## 2021-09-10 ENCOUNTER — Other Ambulatory Visit: Payer: Self-pay

## 2021-09-10 DIAGNOSIS — Z3009 Encounter for other general counseling and advice on contraception: Secondary | ICD-10-CM

## 2021-09-10 DIAGNOSIS — Z30017 Encounter for initial prescription of implantable subdermal contraceptive: Secondary | ICD-10-CM | POA: Diagnosis not present

## 2021-09-10 LAB — POCT URINE PREGNANCY: Preg Test, Ur: NEGATIVE

## 2021-09-10 MED ORDER — ETONOGESTREL 68 MG ~~LOC~~ IMPL
68.0000 mg | DRUG_IMPLANT | Freq: Once | SUBCUTANEOUS | Status: AC
  Administered 2021-09-10: 68 mg via SUBCUTANEOUS

## 2021-09-10 NOTE — Progress Notes (Signed)
Post Partum Visit Note  Rachel Heath is a 30 y.o. [id number] female who presents for a postpartum visit. She is 4 weeks postpartum following a normal spontaneous vaginal delivery.  I have fully reviewed the prenatal and intrapartum course. The delivery was at 38.4 gestational weeks.  Anesthesia: epidural. Postpartum course has been uncomplicated. Baby is doing well. Baby is feeding by breast. Bleeding thin lochia. Bowel function is normal. Bladder function is normal. Patient is not sexually active. Contraception method is Nexplanon. Postpartum depression screening: negative.   The pregnancy intention screening data noted above was reviewed. Potential methods of contraception were discussed. The patient elected to proceed with No data recorded.   Edinburgh Postnatal Depression Scale - 09/10/21 1046       Edinburgh Postnatal Depression Scale:  In the Past 7 Days   I have been able to laugh and see the funny side of things. 0    I have looked forward with enjoyment to things. 0    I have blamed myself unnecessarily when things went wrong. 0    I have been anxious or worried for no good reason. 0    I have felt scared or panicky for no good reason. 0    Things have been getting on top of me. 0    I have been so unhappy that I have had difficulty sleeping. 0    I have felt sad or miserable. 0    I have been so unhappy that I have been crying. 0    The thought of harming myself has occurred to me. 0    Edinburgh Postnatal Depression Scale Total 0             Health Maintenance Due  Topic Date Due   COVID-19 Vaccine (1) Never done   INFLUENZA VACCINE  07/20/2021    The following portions of the patient's history were reviewed and updated as appropriate: allergies, current medications, past family history, past medical history, past social history, past surgical history, and problem list.  Review of Systems A comprehensive review of systems was negative.  Objective:  BP 111/74    Pulse 81   Ht 5\' 5"  (1.651 m)   Wt 187 lb 14.4 oz (85.2 kg)   Breastfeeding Yes   BMI 31.27 kg/m    General:  alert and no distress   Breasts:  normal  Lungs: clear to auscultation bilaterally  Heart:  regular rate and rhythm, S1, S2 normal, no murmur, click, rub or gallop  Abdomen: soft, non-tender; bowel sounds normal; no masses,  no organomegaly   Wound : none  GU exam:  normal       Assessment:    1. Postpartum care following vaginal delivery - doing well  2. Encounter for other general counseling and advice on contraception - wants Nexplanon  3. Nexplanon insertion Rx: - POCT urine pregnancy:  Negative   Plan:   Essential components of care per ACOG recommendations:  1.  Mood and well being: Patient with negative depression screening today. Reviewed local resources for support.  - Patient tobacco use? No.   - hx of drug use? No.    2. Infant care and feeding:  -Patient currently breastmilk feeding? Yes. Discussed returning to work and pumping. Reviewed importance of draining breast regularly to support lactation.  -Social determinants of health (SDOH) reviewed in EPIC. No concerns  3. Sexuality, contraception and birth spacing - Patient does not want a pregnancy in the next year.  Desired family size is 4 children.  - Reviewed forms of contraception in tiered fashion. Patient desired  Nexplanon  today.   - Discussed birth spacing of 18 months  4. Sleep and fatigue -Encouraged family/partner/community support of 4 hrs of uninterrupted sleep to help with mood and fatigue  5. Physical Recovery  - Discussed patients delivery and complications. She describes her labor as good. - Patient had a Vaginal, no problems at delivery. Patient had a 2nd degree laceration. Perineal healing reviewed. Patient expressed understanding - Patient has urinary incontinence? No. - Patient is not safe to resume physical and sexual activity  6.  Health Maintenance - HM due items  addressed Yes - Last pap smear  Diagnosis  Date Value Ref Range Status  01/28/2021   Final   - Negative for intraepithelial lesion or malignancy (NILM)   Pap smear not done at today's visit.  -Breast Cancer screening indicated? No.   7. Chronic Disease/Pregnancy Condition follow up: None    Coral Ceo, MD [facility], [facility]  09/10/21    Nexplanon Procedure Note   PRE-OP DIAGNOSIS: Desired Long-Acting, Reversible Contraception ( LARC ) POST-OP DIAGNOSIS: Same  PROCEDURE: Nexplanon  placement Performing Provider: Brock Bad, MD  Patient education prior to procedure, explained risk, benefits of Nexplanon, reviewed alternative options. Patient reported understanding. Gave consent to continue with procedure.   PROCEDURE:  Pregnancy Text :  Negative Site (check):      left arm         Sterile Preparation:   Betadinex3 Lot #:  [id number] Expiration Date:  2024 AUG 12  Insertion site was selected 8 - 10 cm from medial epicondyle and marked along with guiding site using sterile marker. Procedure area was prepped and draped in a sterile fashion. 1% Lidocaine 1.5 ml given prior to procedure. Nexplanon  was inserted subcutaneously.Needle was removed from the insertion site. Nexplanon capsule was palpated by provider and patient to assure satisfactory placement. Dressing applied.  Followup: The patient tolerated the procedure well without complications.  Standard post-procedure care is explained and return precautions are given.  Brock Bad, MD 09/10/2021 1:19 PM

## 2021-09-24 ENCOUNTER — Ambulatory Visit: Payer: Medicaid Other | Admitting: Obstetrics

## 2021-09-24 ENCOUNTER — Encounter: Payer: Self-pay | Admitting: Obstetrics

## 2021-09-24 ENCOUNTER — Other Ambulatory Visit: Payer: Self-pay

## 2021-09-24 DIAGNOSIS — Z3046 Encounter for surveillance of implantable subdermal contraceptive: Secondary | ICD-10-CM | POA: Diagnosis not present

## 2021-09-24 NOTE — Progress Notes (Signed)
Subjective:    Rachel Heath is a 30 y.o. female who presents for 2 week follow up after Nexplanon insertion. The patient has no complaints today. The patient is sexually active. Pertinent past medical history: urinary tract infections.  The information documented in the HPI was reviewed and verified.  Menstrual History: OB History     Gravida  6   Para  4   Term  4   Preterm      AB  2   Living  4      SAB  1   IAB  1   Ectopic      Multiple  0   Live Births  4            No LMP recorded.   Patient Active Problem List   Diagnosis Date Noted   Postpartum UTI 08/08/2021   Vaginal delivery 08/06/2021   High serum vitamin D 02/16/2021   Urinary tract infection in pregnancy, antepartum, first trimester 01/31/2021   Obesity in pregnancy 01/28/2021   Supervision of normal pregnancy, antepartum 12/17/2020   Heart murmur    Family history of sickle cell trait    Sickle cell trait (HCC) 10/25/2016   Rh negative state in antepartum period 10/25/2016   Past Medical History:  Diagnosis Date   Ganglion cyst    right hand   Heart murmur    per parent   Sickle cell trait (HCC)     Past Surgical History:  Procedure Laterality Date   HAND SURGERY       Current Outpatient Medications:    benzocaine-Menthol (DERMOPLAST) 20-0.5 % AERO, Apply 1 application topically as needed for irritation (perineal discomfort)., Disp: , Rfl:    coconut oil OIL, Apply 1 application topically as needed., Disp: , Rfl: 0   ibuprofen (ADVIL) 600 MG tablet, Take 1 tablet (600 mg total) by mouth every 6 (six) hours., Disp: 30 tablet, Rfl: 0   Prenatal Vit-Fe Fumarate-FA (PRENATAL MULTIVITAMIN) TABS tablet, Take 1 tablet by mouth daily at 12 noon., Disp: , Rfl:    witch hazel-glycerin (TUCKS) pad, Apply 1 application topically as needed for hemorrhoids., Disp: 40 each, Rfl: 12 No Known Allergies  Social History   Tobacco Use   Smoking status: Former    Types: Cigars    Quit date:  05/19/2016    Years since quitting: 5.3   Smokeless tobacco: Never  Substance Use Topics   Alcohol use: No    Family History  Problem Relation Age of Onset   Hypertension Mother    Heart disease Maternal Grandfather        Review of Systems Constitutional: negative for weight loss Genitourinary:negative for abnormal menstrual periods and vaginal discharge   Objective:   There were no vitals taken for this visit.   General:   Alert and no distress  Skin:   no rash or abnormalities  Lungs:   clear to auscultation bilaterally  Heart:   regular rate and rhythm, S1, S2 normal, no murmur, click, rub or gallop  Breasts:   Not examined  Abdomen:  normal findings: no organomegaly, soft, non-tender and no hernia           Left Upper Extremity:  Nexplanon insertion site is clean, dry and intact.  Rod palpated, intact and non tender   Lab Review Urine pregnancy test Labs reviewed yes Radiologic studies reviewed no  I have spent a total of 15 minutes of face-to-face time, excluding clinical staff time, reviewing  notes and preparing to see patient, ordering tests and/or medications, and counseling the patient.   Assessment:    30 y.o., continuing Nexplanon, no contraindications.   Plan:   1. Encounter for surveillance of Nexplanon subdermal contraceptive   All questions answered. Contraception: Nexplanon. Discussed healthy lifestyle modifications. Follow up in 5 months.  Annual / Pap   Brock Bad, MD 09/24/2021 11:42 AM
